# Patient Record
Sex: Male | Born: 1945 | Race: White | Hispanic: No | Marital: Married
Health system: Midwestern US, Community
[De-identification: ages and names within clinical notes are randomized; demographics above are authoritative.]

## PROBLEM LIST (undated history)

## (undated) DIAGNOSIS — E785 Hyperlipidemia, unspecified: Secondary | ICD-10-CM

## (undated) DIAGNOSIS — M778 Other enthesopathies, not elsewhere classified: Secondary | ICD-10-CM

## (undated) DIAGNOSIS — I2510 Atherosclerotic heart disease of native coronary artery without angina pectoris: Secondary | ICD-10-CM

## (undated) DIAGNOSIS — E782 Mixed hyperlipidemia: Secondary | ICD-10-CM

## (undated) DIAGNOSIS — M24131 Other articular cartilage disorders, right wrist: Secondary | ICD-10-CM

## (undated) DIAGNOSIS — R319 Hematuria, unspecified: Secondary | ICD-10-CM

## (undated) DIAGNOSIS — E8881 Metabolic syndrome: Secondary | ICD-10-CM

---

## 2011-02-28 NOTE — Progress Notes (Signed)
HISTORY OF PRESENT ILLNESS  Edward Villarreal is a 65 y.o. male. Referred by Alma Friendly, MD for evaluation of DOE and dizziness.    HPI  Known CAD by Coronary artery calcium scoring (CAC) (score 42 Dec 2009) now with exertional DOE and lightheadeness with aerobic exercise/stairs. He has been active, plays golf regularly. He has noted these sx over the past six months while going to the gym     Cardiac risk factors   HTN no  DM  no  Smoking no  Family hx of CAD yes - father with CABG      Past Medical History   Diagnosis Date   ??? DJD (degenerative joint disease)      L4-L5, tx with PT and nerve blocks   ??? GERD (gastroesophageal reflux disease)    ??? Hyperlipidemia    ??? Sinus bradycardia    ??? Mixed hyperlipidemia    ??? IGT (impaired glucose tolerance)    ??? CAD (coronary artery disease)      CAC 42 Dec 2009     Past Surgical History   Procedure Date   ??? Hx lithotripsy 2007   ??? Biopsy prostate    ??? Hx knee arthroscopy      left     Current Outpatient Prescriptions   Medication Sig Dispense Refill   ??? ascorbic acid (VITAMIN C) 500 mg tablet Take 2,000 mg by mouth two (2) times a day.         ??? ibuprofen (MOTRIN) 400 mg tablet Take 400 mg by mouth two (2) times a day.         ??? aspirin 81 mg tablet Take 162 mg by mouth daily.         ??? omega-3 fatty acids-vitamin e (FISH OIL) 1,000 mg Cap Take 1 Cap by mouth daily.         ??? flaxseed oil 1,000 mg Cap Take 1 Cap by mouth daily.         ??? glucosamine (GLUCOSAMINE RELIEF) 1,000 mg Tab Take 1 Tab by mouth daily.         ??? atorvastatin (LIPITOR) 20 mg tablet Take  by mouth. Alternate 20mg  and 40mg  qod        ??? multivitamin (ONE A DAY) tablet Take 1 Tab by mouth daily.         ??? ranitidine (ZANTAC) 75 mg tablet Take 75 mg by mouth two (2) times daily as needed.         ??? Cholecalciferol, Vitamin D3, (VITAMIN D3) 1,000 unit Cap Take 1 Cap by mouth daily.           No Known Allergies    Married. Retired - Garment/textile technologist. Nonsmoker.     Review of Systems    Constitutional: Negative for fever, chills, malaise/fatigue and diaphoresis.   Respiratory: Negative for cough, hemoptysis, sputum production and wheezing.    Cardiovascular: Negative for chest pain, palpitations, orthopnea, claudication, leg swelling and PND.   Gastrointestinal: Negative for heartburn, nausea, vomiting, blood in stool and melena.   Genitourinary: Negative for dysuria and flank pain.   Musculoskeletal: Positive for back pain. Negative for joint pain.   Skin: Negative for rash.   Neurological: Positive for dizziness. Negative for focal weakness, seizures, loss of consciousness, weakness and headaches.   Endo/Heme/Allergies: Does not bruise/bleed easily.   Psychiatric/Behavioral: Negative for memory loss. The patient does not have insomnia.        BP 122/74  Pulse 60   Ht 5\' 5"  (1.651 m)   Wt 173 lb 12.8 oz (78.835 kg)   BMI 28.92 kg/m2    Physical Exam   Vitals reviewed.  Constitutional: He is oriented to person, place, and time. He appears well-developed.   Neck: Neck supple. No JVD present.   Cardiovascular: Normal rate, regular rhythm and normal heart sounds.  Exam reveals no gallop and no friction rub.    No murmur heard.  Pulses:       Carotid pulses are 2+ on the right side, and 2+ on the left side.       Dorsalis pedis pulses are 2+ on the right side, and 2+ on the left side.   Pulmonary/Chest: Effort normal and breath sounds normal. He has no wheezes. He has no rales.   Abdominal: Soft. Bowel sounds are normal.   Musculoskeletal: He exhibits no edema.   Neurological: He is alert and oriented to person, place, and time.   Skin: Skin is warm and dry.   Psychiatric: He has a normal mood and affect.     EKG 02/14/11 - SB 50, otherwise normal    Lipids March 2012 TC 139 LDL 69 HDL 39 TG 128  FBS 161    ASSESSMENT and PLAN  Encounter Diagnoses   Name Primary?   ??? DOE (dyspnea on exertion) Yes   ??? Dizziness    ??? CAD (coronary artery disease)       Chronic exertional sx - DOE, dizziness but no anginal chest pain, in the setting of documented CAD by CAC. Differential dx from cardiac standpoint includes chronotropic incompetence, myocardial ischemia. No evidence of LV dysfunction or valvular disease by hx, exam and previous echo.   Favor stress cardiolite to evaluate HR, ischemia. Reserve cath for evidence of ischemia.   Long discussion about his lipids/IGT. Reduce consumption of white flour and sugar. May benefit from Advanced lipid testing at some point.     Phone follow up after reviewing tests    Rhona Leavens, MD

## 2011-03-02 NOTE — Telephone Encounter (Signed)
Unable to leave message- phone goes dead after 2 rings.

## 2011-03-05 MED ORDER — NUCLEAR MEDICINE ISOTOPE
Freq: Once | Status: AC
Start: 2011-03-05 — End: 2011-03-05

## 2011-03-05 NOTE — Progress Notes (Signed)
See scanned report. Dr. Kapadia ordered and Dr. Kapadia read study.

## 2011-03-07 NOTE — Telephone Encounter (Signed)
PT would like a call back regarding nuclear results. York Spaniel will only be available today or thursday after 3:00. PT may be reached [phone].      Thanks

## 2011-03-07 NOTE — Telephone Encounter (Signed)
Past Surgical History   Procedure Date   ??? Hx lithotripsy 2007   ??? Biopsy prostate    ??? Hx knee arthroscopy      left   ??? Stress test cardiolite  Normal exercise cardiolite, no inducible ischemia, EF 59% 03/05/11     Spoke with patient regarding normal nuclear stress test results. He still has some questions regarding his dizziness after work out. Please contact patient to discuss results further . Thanks.

## 2011-03-09 NOTE — Telephone Encounter (Signed)
Reviewed stress test findings with pt. Will see him back in a year.

## 2012-03-07 MED ORDER — METFORMIN SR 500 MG 24 HR TABLET
500 mg | ORAL_TABLET | Freq: Every day | ORAL | Status: DC
Start: 2012-03-07 — End: 2012-06-02

## 2012-03-07 NOTE — Patient Instructions (Signed)
I recommend starting metformin extended release preparation, starting at 500 mg daily with supper for a week, then increasing to 1500 mg gradually over time. Increase to 1000 mg for 2 weeks, then 1500 mg daily for 3 weeks.     The best diet for you is a low glycemic index (GI), Mediterranean-style diet.  Glycemic index is a measure of how quickly food turns into sugar in your blood.  You need to eat foods with a LOWER GI.  It is easy to search the Internet for lists of foods and their associated GIs.  Things that are white: sugar, white flour, white pasta, white bread, white rice -- are high GI foods, as are white potatoes.  Avoid these and eat foods with a lower GI.  This will help keep your blood sugar more stable and lower.

## 2012-03-07 NOTE — Progress Notes (Signed)
HISTORY OF PRESENT ILLNESS  Edward Villarreal is a 66 y.o. male. Last seen a year ago    HPI  Feels well. No exertional sx. Exercises regularly. Patient denies any exertional chest pain, dyspnea, palpitations, syncope, orthopnea, edema or paroxysmal nocturnal dyspnea.    Cardiac risk factors   HTN no  DM  no  Smoking no  Family hx of CAD yes - father with CABG    Cardiac testing  Exercise cardiolite 02/2011 - 12 min, normal perfusion, EF 59%      Past Medical History   Diagnosis Date   ??? DJD (degenerative joint disease)      L4-L5, tx with PT and nerve blocks   ??? GERD (gastroesophageal reflux disease)    ??? Hyperlipidemia    ??? Sinus bradycardia    ??? Mixed hyperlipidemia    ??? IGT (impaired glucose tolerance)    ??? CAD (coronary artery disease)      CAC 42 Dec 2009       Current Outpatient Prescriptions   Medication Sig Dispense Refill   ??? propranolol (INDERAL) 80 mg tablet Take 80 mg by mouth as needed.       ??? SILODOSIN (RAPAFLO PO) Take  by mouth every other day.       ??? ibuprofen (MOTRIN) 400 mg tablet Take 400 mg by mouth two (2) times a day.       ??? aspirin 81 mg tablet Take 81 mg by mouth daily.       ??? omega-3 fatty acids-vitamin e (FISH OIL) 1,000 mg Cap Take 1 Cap by mouth daily.         ??? glucosamine (GLUCOSAMINE RELIEF) 1,000 mg Tab Take 1 Tab by mouth daily.         ??? atorvastatin (LIPITOR) 20 mg tablet Take  by mouth. Alternate 20mg  and 40mg  qod        ??? multivitamin (ONE A DAY) tablet Take 1 Tab by mouth daily.         ??? ranitidine (ZANTAC) 75 mg tablet Take 75 mg by mouth two (2) times daily as needed.         ??? Cholecalciferol, Vitamin D3, (VITAMIN D3) 1,000 unit Cap Take 1 Cap by mouth two (2) times a day.         No Known Allergies    Married. Retired - Garment/textile technologist. Nonsmoker.     Review of Systems   Constitutional: Negative for fever, chills, malaise/fatigue and diaphoresis.   Respiratory: Negative for cough, hemoptysis, sputum production and wheezing.    Cardiovascular: Negative for chest pain,  palpitations, orthopnea, claudication, leg swelling and PND.   Gastrointestinal: Negative for heartburn, nausea, vomiting, blood in stool and melena.   Genitourinary: Negative for dysuria and flank pain.   Musculoskeletal: Positive for back pain. Negative for joint pain.   Skin: Negative for rash.   Neurological: Positive for dizziness. Negative for focal weakness, seizures, loss of consciousness, weakness and headaches.   Endo/Heme/Allergies: Does not bruise/bleed easily.   Psychiatric/Behavioral: Negative for memory loss. The patient does not have insomnia.        BP 120/80   Pulse 50   Ht 5\' 5"  (1.651 m)   Wt 172 lb (78.019 kg)   BMI 28.62 kg/m2   SpO2 97%    Physical Exam   Vitals reviewed.  Constitutional: He is oriented to person, place, and time. He appears well-developed.   Neck: Neck supple. No JVD present.  Cardiovascular: Normal rate, regular rhythm and normal heart sounds.  Exam reveals no gallop and no friction rub.    No murmur heard.  Pulses:       Carotid pulses are 2+ on the right side, and 2+ on the left side.       Dorsalis pedis pulses are 2+ on the right side, and 2+ on the left side.   Pulmonary/Chest: Effort normal and breath sounds normal. He has no wheezes. He has no rales.   Abdominal: Soft. Bowel sounds are normal.   Musculoskeletal: He exhibits no edema.   Neurological: He is alert and oriented to person, place, and time.   Skin: Skin is warm and dry.   Psychiatric: He has a normal mood and affect.     EKG today - SB 44 otherwise normal    Lipids March 2012 TC 139 LDL 69 HDL 39 TG 128  FBS 409  NMR lipids 01/28/12 - LDLp 1185, FBS 126    ASSESSMENT and PLAN  Encounter Diagnoses   Name Primary?   ??? CAD (coronary artery disease) Yes   ??? Mixed hyperlipidemia    ??? T2DM (type 2 diabetes mellitus)    ??? Sinus bradycardia      No sx of angina or bradycardia.     Long discussion about his lipids/IGT. Reduce consumption of white flour and sugar but i am not convinced about his motivation to reduce  sugar. I recommend starting metformin extended release preparation, starting at 500 mg daily with supper for a week, then increasing to 1500 mg gradually over time. Increase to 1000 mg for 2 weeks, then 1500 mg daily for 3 weeks.     The patient was encouraged to continue current medications, exercise, lose weight and call with any new complaints or concerns.      Rhona Leavens, MD

## 2012-03-21 NOTE — Telephone Encounter (Signed)
Spoke with patient, id verified. He is experiencing Gi side effects from metformin. We discussed side effects and how to deal with them. For now he is going to reintroduce metformin more slowly and pay more attention to diet, take it with food, etc. He will restart on one metformin 500 at night for 2 weeks then increase to 2 at night for a 2 weeks and update me by phone on his condition. He will call back with any changes in condition, questions, or concerns.

## 2012-03-21 NOTE — Telephone Encounter (Signed)
Pt called stating he is experiencing nausea, diahrea and other symptoms from metFORMIN ER (GLUCOPHAGE XR) 500 mg tablet. Pt can be reached at [phone]. Id verified.

## 2012-05-28 NOTE — Progress Notes (Signed)
HDL lab results from 05/20/12:      Lab test High risk Intermediate risk  Optimal   TC   128   LDL   79   HDL   41   TG   88   Non-HDL   87   Apo-B  62    LDL-p   875   %sdLDL   24   HDL2 8     Lp(a) chol 154     Myeloperoxidase   267   hs-CRP   <0.3   INT-proBNP   62   Aspirin Works   1085   ApoE genotype   3/3   CYP2C19   *1/*1   Insulin   5   Glucose   92   Vitamin D   55   campesterol  4.04 optimal    Campesterol ratio 305 hyper     sitosterol  3.11 optimal    Sitosterol ratio 227 hyper     cholestanol  3.12 optimal    Cholestanol ratio 242 hyper     desmosterol   0.40 hypo   Desmosterol ratio  31    HS Omega 3 index  6.0

## 2012-06-02 MED ORDER — ZOLPIDEM 5 MG TAB
5 mg | ORAL_TABLET | Freq: Every evening | ORAL | Status: DC | PRN
Start: 2012-06-02 — End: 2013-02-05

## 2012-06-02 MED ORDER — METFORMIN SR 500 MG 24 HR TABLET
500 mg | ORAL_TABLET | Freq: Every day | ORAL | Status: DC
Start: 2012-06-02 — End: 2012-09-01

## 2012-06-02 NOTE — Progress Notes (Signed)
HISTORY OF PRESENT ILLNESS  Edward Villarreal is a 66 y.o. male. Last seen 3 months ago    HPI  Appetite reduced but still has sugar craving, albeit less. Tolerating metformin. Feels well. No exertional sx. Plays golf regularly.  Patient denies any exertional chest pain, dyspnea, palpitations, syncope, orthopnea, edema or paroxysmal nocturnal dyspnea.    Takes propanolol prn golf tournaments - tremors    Cardiac risk factors   HTN no  DM  no  Smoking no  Family hx of CAD yes - father with CABG    Cardiac testing  Exercise cardiolite 02/2011 - 12 min, normal perfusion, EF 59%      Past Medical History   Diagnosis Date   ??? DJD (degenerative joint disease)      L4-L5, tx with PT and nerve blocks   ??? GERD (gastroesophageal reflux disease)    ??? Hyperlipidemia    ??? Sinus bradycardia    ??? Mixed hyperlipidemia    ??? IGT (impaired glucose tolerance)    ??? CAD (coronary artery disease)      CAC 42 Dec 2009       Current Outpatient Prescriptions   Medication Sig Dispense Refill   ??? propranolol (INDERAL) 80 mg tablet Take 80 mg by mouth as needed.       ??? metFORMIN ER (GLUCOPHAGE XR) 500 mg tablet Take 3 Tabs by mouth daily (with dinner).  90 Tab  3   ??? ibuprofen (MOTRIN) 400 mg tablet Take 400 mg by mouth two (2) times a day.       ??? aspirin 81 mg tablet Take 81 mg by mouth daily.       ??? omega-3 fatty acids-vitamin e (FISH OIL) 1,000 mg Cap Take 1 Cap by mouth daily.         ??? atorvastatin (LIPITOR) 20 mg tablet Take  by mouth. Alternate 20mg  and 40mg  qod        ??? multivitamin (ONE A DAY) tablet Take 1 Tab by mouth daily.         ??? ranitidine (ZANTAC) 75 mg tablet Take 75 mg by mouth two (2) times daily as needed.         ??? Cholecalciferol, Vitamin D3, (VITAMIN D3) 1,000 unit Cap Take 1 Cap by mouth two (2) times a day.         No Known Allergies    Married. Retired - Garment/textile technologist. Nonsmoker.     Review of Systems   Constitutional: Negative for fever, chills, malaise/fatigue and diaphoresis.   Respiratory: Negative for cough,  hemoptysis, sputum production and wheezing.    Cardiovascular: Negative for chest pain, palpitations, orthopnea, claudication, leg swelling and PND.   Gastrointestinal: Negative for heartburn, nausea, vomiting, blood in stool and melena.   Genitourinary: Negative for dysuria and flank pain.   Musculoskeletal: Positive for back pain. Negative for joint pain.   Skin: Negative for rash.   Neurological: Positive for dizziness. Negative for focal weakness, seizures, loss of consciousness, weakness and headaches.   Endo/Heme/Allergies: Does not bruise/bleed easily.   Psychiatric/Behavioral: Negative for memory loss. The patient does not have insomnia.        BP 130/80   Pulse 52   Ht 5\' 5"  (1.651 m)   Wt 175 lb (79.379 kg)   BMI 29.12 kg/m2   SpO2 98%  Wt Readings from Last 3 Encounters:   06/02/12 175 lb (79.379 kg)   03/07/12 172 lb (78.019 kg)   02/28/11  173 lb 12.8 oz (78.835 kg)     Physical Exam   Vitals reviewed.  Constitutional: He is oriented to person, place, and time. He appears well-developed.   Neck: Neck supple. No JVD present.   Cardiovascular: Normal rate, regular rhythm and normal heart sounds.  Exam reveals no gallop and no friction rub.    No murmur heard.  Pulses:       Carotid pulses are 2+ on the right side, and 2+ on the left side.       Dorsalis pedis pulses are 2+ on the right side, and 2+ on the left side.   Pulmonary/Chest: Effort normal and breath sounds normal. He has no wheezes. He has no rales.   Abdominal: Soft. Bowel sounds are normal.   Musculoskeletal: He exhibits no edema.   Neurological: He is alert and oriented to person, place, and time.   Skin: Skin is warm and dry.   Psychiatric: He has a normal mood and affect.     EKG today - SB 44 otherwise normal    Lipids March 2012 TC 139 LDL 69 HDL 39 TG 128  FBS 161  NMR lipids 01/28/12 - LDLp 1185, FBS 126  HDL lab results from 05/20/12:   Lab test  High risk  Intermediate risk  Optimal    TC    128    LDL    79    HDL    41    TG    88     Non-HDL    87    Apo-B   62     LDL-p    875    %sdLDL    24    HDL2  8      Lp(a) chol  154      Myeloperoxidase    267    hs-CRP    <0.3    INT-proBNP    62    Aspirin Works    1085    ApoE genotype    3/3    CYP2C19    *1/*1    Insulin    5    Glucose    92    Vitamin D    55    campesterol   4.04 optimal     Campesterol ratio  305 hyper      sitosterol   3.11 optimal     Sitosterol ratio  227 hyper      cholestanol   3.12 optimal     Cholestanol ratio  242 hyper      desmosterol    0.40 hypo    Desmosterol ratio   31     HS Omega 3 index   6.0         ASSESSMENT and PLAN  Encounter Diagnoses   Name Primary?   ??? Mixed hyperlipidemia Yes   ??? CAD (coronary artery disease)    ??? T2DM (type 2 diabetes mellitus)    ??? Elevated Lp(a)      No sx of angina or bradycardia.     Great response to metformin - optimized LDL-p, insulin and FBS. Has elevated Lp(a) - discussed this marker at length. Continue current Rx. Long discussion about optimizing his diet    Upcoming travel - pt requests ambien for jet lag    The patient was encouraged to continue current medications, exercise, lose weight and call with any new complaints or concerns.    Follow-up Disposition:  Return in about 6 months (  around 12/03/2012).    HDL labs prior  .  Rhona Leavens, MD

## 2012-09-01 NOTE — Telephone Encounter (Signed)
Verbal Order Jolinda Croak

## 2012-09-02 NOTE — Telephone Encounter (Signed)
Verbal order for refill per Dr. Kapadia

## 2012-09-02 NOTE — Addendum Note (Signed)
Addended by: Lubertha Basque on: 09/02/2012 11:55 AM     Modules accepted: Orders

## 2012-09-02 NOTE — Telephone Encounter (Signed)
What medication does he need a hard copy of??

## 2012-09-02 NOTE — Telephone Encounter (Signed)
Spoke with pharmacist from [facility], Jonny Ruiz. Advised that Metformin was sent in error.

## 2012-09-02 NOTE — Telephone Encounter (Signed)
Pt. Didn't need it called into the pharmacy he needed a hard copy of the script pt. Will be coming to pick it up sometime this afternoon. Thank you

## 2013-01-23 LAB — SMALL DENSE LDL (HDL ONLY)
% sdLDL (calculated): 29 — ABNORMAL HIGH (ref <26)
Fasting, Y/N/Hrs: 12
Small Dense LDL: 23 mg/dL — ABNORMAL HIGH (ref <21)

## 2013-01-23 LAB — LIPID PANEL
Cholesterol, total: 139 mg/dL (ref <200)
Fasting, Y/N/Hrs: 12
HDL Cholesterol: 47 mg/dL (ref >39)
LDL, calculated: 79 mg/dL (ref <100)
Non-HDL Cholesterol: 92 mg/dL (ref <130)
Triglyceride: 154 mg/dL — ABNORMAL HIGH (ref <150)

## 2013-01-23 LAB — CRP, HIGH SENSITIVITY
CRP, High sensitivity: <0.3 mg/L (ref <1.0)
Fasting, Y/N/Hrs: 12

## 2013-01-23 LAB — FIBRINOGEN
Fasting, Y/N/Hrs: 12
Fibrinogen Antigen: 371 mg/dL (ref <391)

## 2013-01-23 LAB — VITAMIN D, 25 HYDROXY
Fasting, Y/N/Hrs: 12
VITAMIN D, 25-HYDROXY: 46 ng/mL (ref 30–100)

## 2013-01-23 LAB — FATTY ACIDS, FREE
FFA free fatty acids: 0.77 mmol/L — ABNORMAL HIGH (ref <0.60)
Fasting, Y/N/Hrs: 12

## 2013-01-23 LAB — HDL 2 SUBCLASS (HDL ONLY)
Fasting, Y/N/Hrs: 12
HDL 2 subclass: 14 mg/dL (ref >11)

## 2013-01-23 LAB — LIPOPROTEIN (A) MASS (HDL ONLY)
Fasting, Y/N/Hrs: 12
Lipoprotein (a): 71 mg/dL — ABNORMAL HIGH (ref <30)

## 2013-01-24 LAB — METABOLIC PANEL, COMPREHENSIVE
ALT (SGPT): 15 U/L (ref <42)
AST (SGOT): 16 U/L (ref <41)
Albumin: 4.4 g/dL (ref 3.5–5.2)
Alk. phosphatase: 78 U/L (ref 40–129)
BUN: 31 mg/dL — ABNORMAL HIGH (ref 6–20)
Bilirubin, total: 0.7 mg/dL (ref <1.3)
CO2: 25 mmol/L (ref 22–29)
Calcium: 9.1 mg/dL (ref 8.6–10.2)
Chloride: 107 mmol/L (ref 98–107)
Creatinine: 0.9 mg/dL (ref 0.7–1.2)
Fasting, Y/N/Hrs: 12
Glucose: 96 mg/dL (ref 70–99)
Potassium: 4.5 mmol/L (ref 3.5–5.1)
Protein, total: 7.2 g/dL (ref 6.1–8.0)
Sodium: 144 mmol/L (ref 133–145)

## 2013-01-24 LAB — URINE CREATININE (HDL ONLY)
Creatinine w AspirinWorks: 138.2 mg/dL (ref 39.0–259.0)
Creatinine w AspirinWorks: 138.2 mg/dL (ref 39.0–259.0)
Fasting, Y/N/Hrs: 12
Fasting, Y/N/Hrs: 12

## 2013-01-24 LAB — APOLIPOPROTEIN B
Apolipoprotein B: 70 mg/dL — ABNORMAL HIGH (ref <60)
Fasting, Y/N/Hrs: 12

## 2013-01-24 LAB — LP(A)-P (HDL ONLY)
Fasting, Y/N/Hrs: 12
Fasting, Y/N/Hrs: 12
Lp(a)-P: 191 nmol/L — ABNORMAL HIGH (ref <75)
Lp(a)-P: 191 nmol/L — ABNORMAL HIGH (ref <75)

## 2013-01-24 LAB — LDL-P HDL (HDL ONLY)
Fasting, Y/N/Hrs: 12
Fasting, Y/N/Hrs: 12
HDL HDL-P: 37.1 umol/L — ABNORMAL LOW (ref >38.0)
HDL HDL-P: 37.1 umol/L — ABNORMAL LOW (ref >38.0)
HDL LDL-P: 899 nmol/L (ref <1020)
HDL LDL-P: 899 nmol/L (ref <1020)
HDL SLDL-P: 210 nmol/L
HDL SLDL-P: 210 nmol/L

## 2013-01-24 LAB — INSULIN
Fasting, Y/N/Hrs: 12
Insulin: 14 uU/mL — ABNORMAL HIGH (ref 3–9)

## 2013-01-24 LAB — MYELOPEROXIDASE, AB
Fasting, Y/N/Hrs: 12
Myeloperoxidase: 217 pmol/L (ref <400)

## 2013-01-24 LAB — LIPOPROTEIN ASSOC PHOSPHOLIPID
Fasting, Y/N/Hrs: 12
Lp-PLA2: 127 ng/mL (ref <200)

## 2013-01-24 LAB — APOLIPOPROTEIN A-1
Apo B: Apo A1 Ratio: 0.53 — ABNORMAL LOW (ref 0.61–0.80)
Apolipoprotein A-1: 133 mg/dL (ref >131)
Fasting, Y/N/Hrs: 12

## 2013-01-24 LAB — NT-PRO BNP
Fasting, Y/N/Hrs: 12
Pro BNP,NT: 47 pg/mL (ref <125)

## 2013-01-24 LAB — ASPIRINWORKS(11-DEHYDRO-THROMBOXANE B2)(HDL ONLY)
11-Dehydro-Thromboxane B2: 500 pg/mL (ref <1501)
Fasting, Y/N/Hrs: 12

## 2013-01-25 LAB — STEROLS (HDL ONLY)
Campesterol: 5.22 ug/mL — ABNORMAL HIGH (ref 2.11–4.43)
Cholestanol: 3.15 ug/mL (ref 2.02–3.47)
Desmosterol: 0.57 ug/mL (ref 0.50–1.27)
Fasting, Y/N/Hrs: 12
Sitosterol: 3.62 ug/mL — ABNORMAL HIGH (ref 1.43–3.17)

## 2013-01-25 LAB — BLOOD FATTY ACIDS PROFILE (HDL ONLY)
Alpha linoleic acid n3: 0.115 % (ref 0.100–0.400)
Cis-mono-unsaturated fatty acid total: 16.7 (ref 11.5–20.5)
Docosapentaenoic acid n3: 2.251 % (ref 0.600–4.100)
Docosapentaenoic acid n6: 0.795 % (ref 0.100–1.300)
Fasting, Y/N/Hrs: 12
Omega-3 Fatty Acid total: 7.3 (ref 0.1–14.1)
Omega-3 index: 4.9 (ref 0.1–10.4)
Omega-6 Fatty Acid total: 32.2 (ref 28.6–44.5)
Saturated fatty acid total: 42.8 — ABNORMAL HIGH (ref 36.6–42.0)
Trans fatty acid total: 1.1 (ref 0.1–1.8)
Translinoleic acid: 0.181 % (ref 0.100–0.500)
Transoleic acid: 0.673 % (ref 0.100–1.300)

## 2013-01-27 NOTE — Progress Notes (Signed)
Quick Note:    Will review at visit  ______

## 2013-01-28 NOTE — Progress Notes (Signed)
HDL results from 01/23/13        Lab test High risk Intermediate risk  Optimal   TC   139   LDL   79   HDL   47   TG  086    Non-HDL   92   Apo-B  70    LDL-p   899   %sdLDL  29    HDL2   14   Lp(a) chol      Myeloperoxidase   217   hs-CRP   <0.3   INT-proBNP   47   Aspirin Works   500   ApoE genotype      CYP2C19      Insulin 14     Glucose   96   Vitamin D   46   campesterol 5.22 hyper     Campesterol ratio      sitosterol 3.62 hyper     Sitosterol ratio      cholestanol   3.15   Cholestanol ratio      desmosterol   0.57   Desmosterol ratio      HS Omega 3 index  4.9

## 2013-02-05 NOTE — Progress Notes (Signed)
HISTORY OF PRESENT ILLNESS  Edward Villarreal is a 67 y.o. male. Last seen 8 months ago    Problem List Never Reviewed        ICD-9-CM Class Noted    T2DM (type 2 diabetes mellitus) 250.00  03/07/2012        Sinus bradycardia 427.89  03/07/2012        Mixed hyperlipidemia 272.2  02/28/2011        IGT (impaired glucose tolerance) 790.22  02/28/2011        CAD (coronary artery disease) 414.00  02/28/2011            Cardiac testing  Exercise cardiolite 02/2011 - 12 min, normal perfusion, EF 59%      HPI  Feels good. No interval issues.  Tolerating metformin. Spent the winter in [place] - ate lots of fruit. Consumes gluten free pasta. Does not do regular exercise.. No exertional sx. Plays golf regularly.  Patient denies any exertional chest pain, dyspnea, palpitations, syncope, orthopnea, edema or paroxysmal nocturnal dyspnea.    Taking atorva 40 alternating with 20    Takes propanolol prn golf tournaments - tremors    Cardiac risk factors   HTN no  DM  no  Smoking no  Family hx of CAD yes - father with CABG      Past Medical History   Diagnosis Date   ??? DJD (degenerative joint disease)      L4-L5, tx with PT and nerve blocks   ??? GERD (gastroesophageal reflux disease)    ??? Hyperlipidemia    ??? Sinus bradycardia    ??? Mixed hyperlipidemia    ??? IGT (impaired glucose tolerance)    ??? CAD (coronary artery disease)      CAC 42 Dec 2009       Current Outpatient Prescriptions   Medication Sig Dispense Refill   ??? atorvastatin (LIPITOR) 40 mg tablet Take 40 mg by mouth daily.       ??? metFORMIN ER (GLUCOPHAGE XR) 500 mg tablet Take 3 Tabs by mouth daily (with dinner).  90 Tab  7   ??? propranolol (INDERAL) 80 mg tablet Take 80 mg by mouth as needed.       ??? ibuprofen (MOTRIN) 400 mg tablet Take 400 mg by mouth two (2) times a day.       ??? aspirin 81 mg tablet Take 81 mg by mouth daily.       ??? multivitamin (ONE A DAY) tablet Take 1 Tab by mouth daily.         ??? ranitidine (ZANTAC) 75 mg tablet Take 75 mg by mouth two (2) times daily as needed.          ??? Cholecalciferol, Vitamin D3, (VITAMIN D3) 1,000 unit Cap Take 1 Cap by mouth two (2) times a day.         No Known Allergies    Married. Retired - Garment/textile technologist. Nonsmoker.     Review of Systems   Constitutional: Negative for fever, chills, malaise/fatigue and diaphoresis.   Respiratory: Negative for cough, hemoptysis, sputum production and wheezing.    Cardiovascular: Negative for chest pain, palpitations, orthopnea, claudication, leg swelling and PND.   Gastrointestinal: Negative for heartburn, nausea, vomiting, blood in stool and melena.   Genitourinary: Negative for dysuria and flank pain.   Musculoskeletal: Positive for back pain. Negative for joint pain.   Skin: Negative for rash.   Neurological: Positive for dizziness. Negative for focal weakness, seizures, loss  of consciousness, weakness and headaches.   Endo/Heme/Allergies: Does not bruise/bleed easily.   Psychiatric/Behavioral: Negative for memory loss. The patient does not have insomnia.        BP 128/72   Pulse 48   Resp 16   Ht 5\' 5"  (1.651 m)   Wt 171 lb (77.565 kg)   BMI 28.46 kg/m2   SpO2 98%  Wt Readings from Last 3 Encounters:   02/05/13 171 lb (77.565 kg)   06/02/12 175 lb (79.379 kg)   03/07/12 172 lb (78.019 kg)     Physical Exam   Vitals reviewed.  Constitutional: He is oriented to person, place, and time. He appears well-developed.   Neck: Neck supple. No JVD present.   Cardiovascular: Normal rate, regular rhythm and normal heart sounds.  Exam reveals no gallop and no friction rub.    No murmur heard.  Pulses:       Carotid pulses are 2+ on the right side, and 2+ on the left side.       Dorsalis pedis pulses are 2+ on the right side, and 2+ on the left side.   Pulmonary/Chest: Effort normal and breath sounds normal. He has no wheezes. He has no rales.   Abdominal: Soft. Bowel sounds are normal.   Musculoskeletal: He exhibits no edema.   Neurological: He is alert and oriented to person, place, and time.   Skin: Skin is warm and dry.    Psychiatric: He has a normal mood and affect.     EKG today - SB 44 otherwise normal    Lipids March 2012 TC 139 LDL 69 HDL 39 TG 128  FBS 045  NMR lipids 01/28/12 - LDLp 1185, FBS 126  HDL lab results from 05/20/12:   Lab test  High risk  Intermediate risk  Optimal    TC    128    LDL    79    HDL    41    TG    88    Non-HDL    87    Apo-B   62     LDL-p    875    %sdLDL    24    HDL2  8      Lp(a) chol  154      Myeloperoxidase    267    hs-CRP    <0.3    INT-proBNP    62    Aspirin Works    1085    ApoE genotype    3/3    CYP2C19    *1/*1    Insulin    5    Glucose    92    Vitamin D    55    campesterol   4.04 optimal     Campesterol ratio  305 hyper      sitosterol   3.11 optimal     Sitosterol ratio  227 hyper      cholestanol   3.12 optimal     Cholestanol ratio  242 hyper      desmosterol    0.40 hypo    Desmosterol ratio   31     HS Omega 3 index   6.0       HDL results from 01/23/13   Lab test  High risk  Intermediate risk  Optimal    TC    139    LDL    79    HDL    47  TG   154     Non-HDL    92    Apo-B   70     LDL-p    899    %sdLDL   29     HDL2    14    Lp(a) chol       Myeloperoxidase    217    hs-CRP    <0.3    INT-proBNP    47    Aspirin Works    500    ApoE genotype       CYP2C19       Insulin  14      Glucose    96    Vitamin D    46    campesterol  5.22 hyper      Campesterol ratio       sitosterol  3.62 hyper      Sitosterol ratio       cholestanol    3.15    Cholestanol ratio       desmosterol    0.57    Desmosterol ratio       HS Omega 3 index   4.9     EKG today - sinus 48, normal    ASSESSMENT and PLAN  Encounter Diagnoses   Name Primary?   ??? CAD (coronary artery disease) Yes   ??? Mixed hyperlipidemia    ??? T2DM (type 2 diabetes mellitus)    ??? Agatston CAC score, <100    ??? Sinus bradycardia      No sx of angina or bradycardia.     Advanced lipid testing reviewed  - stable lipoproteins on Rx- reduce atorva to 20 mg/d  - interval increase in insulin/FFA likely related to increased  carbs/fruit. He knows what to do. Advised to exercise regularly including resistance training    The patient was encouraged to continue current medications, exercise, lose weight and call with any new complaints or concerns.    Follow-up Disposition:  Return in about 4 months (around 06/07/2013).    HDL labs prior  .  Rhona Leavens, MD

## 2013-02-05 NOTE — Patient Instructions (Signed)
Insulin worse    Call HDL coaches

## 2013-04-19 NOTE — ED Notes (Signed)
Pt has received verbal and written discharge instructions per Dr. Mason at this time. Pt has no further questions at time of discharge

## 2013-04-19 NOTE — ED Notes (Signed)
67 y.o.  Pt states onset of symptoms Friday pt states that he got stung on left hand by a yellow jack pt states that pain level at this time is 0/10 but itching10/10 pt denies any relief from OTC benadryl

## 2013-04-19 NOTE — ED Provider Notes (Signed)
HPI Comments: Bee sting to L hand 2 days ago.  + swelling, itching, and warmth.  No systemic symptoms.  Pt now c/o itching to L hand.  Taking benadryl and zantac without relief.  No pain.  Pt denies sob, wheezing, chest pain.  No fevers.    Patient is a 67 y.o. male presenting with bee sting. The history is provided by the patient.   Bee sting   Pertinent negatives include no chest pain, no visual disturbance, no abdominal pain, no headaches and no cough.        Past Medical History   Diagnosis Date   ??? DJD (degenerative joint disease)      L4-L5, tx with PT and nerve blocks   ??? GERD (gastroesophageal reflux disease)    ??? Hyperlipidemia    ??? Sinus bradycardia    ??? Mixed hyperlipidemia    ??? IGT (impaired glucose tolerance)    ??? CAD (coronary artery disease)      CAC 42 Dec 2009        Past Surgical History   Procedure Laterality Date   ??? Biopsy prostate     ??? Stress test cardiolite  03/05/11   ??? Hx lithotripsy  2007   ??? Hx knee arthroscopy       left         Family History   Problem Relation Age of Onset   ??? Dementia Mother    ??? Diabetes Mother    ??? Heart Attack Mother    ??? Arthritis-osteo Father    ??? Dementia Father    ??? Other Father      gallbladder disease        History     Social History   ??? Marital Status: MARRIED     Spouse Name: N/A     Number of Children: N/A   ??? Years of Education: N/A     Occupational History   ??? Not on file.     Social History Main Topics   ??? Smoking status: Never Smoker    ??? Smokeless tobacco: Never Used   ??? Alcohol Use: 0.5 oz/week     1 Cans of beer per week      Comment: rarely   ??? Drug Use: No   ??? Sexually Active: Not on file     Other Topics Concern   ??? Not on file     Social History Narrative   ??? No narrative on file                  ALLERGIES: Review of patient's allergies indicates no known allergies.      Review of Systems   Constitutional: Negative for fever and diaphoresis.   HENT: Negative for facial swelling.    Eyes: Negative for visual disturbance.   Respiratory: Negative  for cough.    Cardiovascular: Negative for chest pain.   Gastrointestinal: Negative for abdominal pain.   Genitourinary: Negative for dysuria.   Musculoskeletal: Negative for joint swelling.   Skin: Negative for rash.   Neurological: Negative for headaches.   Hematological: Negative for adenopathy.   Psychiatric/Behavioral: Negative for suicidal ideas.       Filed Vitals:    04/19/13 0832   BP: 163/76   Pulse: 46   Temp: 98.1 ??F (36.7 ??C)   Resp: 18   Height: 5' 5.2" (1.656 m)   Weight: 77.021 kg (169 lb 12.8 oz)   SpO2: 98%  Physical Exam   Nursing note and vitals reviewed.  Constitutional: He is oriented to person, place, and time. He appears well-developed and well-nourished. No distress.   HENT:   Head: Normocephalic and atraumatic.   Mouth/Throat: Oropharynx is clear and moist.   Eyes: Pupils are equal, round, and reactive to light.   Neck: Normal range of motion. Neck supple.   Cardiovascular: Normal rate, regular rhythm, normal heart sounds and intact distal pulses.    Pulmonary/Chest: Effort normal and breath sounds normal. No respiratory distress.   Abdominal: Soft. Bowel sounds are normal. He exhibits no distension. There is no tenderness.   Musculoskeletal: Normal range of motion. He exhibits edema.   Edema to L hand and L wrist.  No evidence of remaining foreign body.  No lymphadenopathy.  NV intact.   Neurological: He is alert and oriented to person, place, and time.   Skin: Skin is warm and dry.        MDM     Differential Diagnosis; Clinical Impression; Plan:     A:  67yo M with localized reaction to bee sting.  VS stable    P:  Hydroxyzine  Ibuprofen  Zantac  Ice/elevation          Procedures

## 2013-06-05 LAB — SMALL DENSE LDL (HDL ONLY)
% sdLDL (calculated): 25 (ref <26)
Fasting, Y/N/Hrs: 14
Small Dense LDL: 25 mg/dL — ABNORMAL HIGH (ref <21)

## 2013-06-05 LAB — FATTY ACIDS, FREE (HDL ONLY)
FFA free fatty acids: 0.61 mmol/L — ABNORMAL HIGH (ref <0.60)
Fasting, Y/N/Hrs: 14

## 2013-06-05 LAB — MYELOPEROXIDASE, AB (HDL ONLY)
Fasting, Y/N/Hrs: 14
MPOITA: 298 pmol/L (ref <321)

## 2013-06-05 LAB — CRP, HIGH SENSITIVITY
CRP, High sensitivity: 0.4 mg/L (ref <1.0)
Fasting, Y/N/Hrs: 14

## 2013-06-05 LAB — FIBRINOGEN (HDL ONLY)
Fasting, Y/N/Hrs: 14
Fibrinogen Antigen: 425 mg/dL (ref 126–437)

## 2013-06-05 LAB — LIPID PANEL (HDL ONLY)
Cholesterol, total: 161 mg/dL (ref <200)
Fasting, Y/N/Hrs: 14
HDL Cholesterol: 46 mg/dL (ref >39)
LDL, calculated: 101 mg/dL — ABNORMAL HIGH (ref <100)
Non-HDL Cholesterol: 114 mg/dL (ref <130)
Triglyceride: 117 mg/dL (ref <150)

## 2013-06-05 LAB — APOLIPOPROTEIN A-1 (HDL ONLY)
Apo B: Apo A1 Ratio: 0.65 (ref 0.61–0.80)
Apolipoprotein A-1: 125 mg/dL — ABNORMAL LOW (ref >131)
Fasting, Y/N/Hrs: 14

## 2013-06-05 LAB — LIPOPROTEIN (A) MASS (HDL ONLY)
Fasting, Y/N/Hrs: 14
Lipoprotein (a): 81 mg/dL — ABNORMAL HIGH (ref <30)

## 2013-06-05 LAB — URINE CREATININE (HDL ONLY)
Creatinine w AspirinWorks: 279.1 mg/dL — ABNORMAL HIGH (ref 39.0–259.0)
Fasting, Y/N/Hrs: 14

## 2013-06-05 LAB — HDL 2 SUBCLASS (HDL ONLY)
Fasting, Y/N/Hrs: 14
HDL 2 subclass: 13 mg/dL (ref >11)

## 2013-06-05 LAB — APOLIPOPROTEIN B (HDL ONLY)
Apolipoprotein B: 81 mg/dL — ABNORMAL HIGH (ref <60)
Fasting, Y/N/Hrs: 14

## 2013-06-06 LAB — LP(A)-P (HDL ONLY)
Fasting, Y/N/Hrs: 14
Lp(a)-P: 223 nmol/L — ABNORMAL HIGH (ref <75)

## 2013-06-06 LAB — METABOLIC PANEL, COMPREHENSIVE (HDL ONLY)
ALT (SGPT): 15 U/L (ref <42)
AST (SGOT): 16 U/L (ref <41)
Albumin: 4.3 g/dL (ref 3.7–5.1)
Alk. phosphatase: 80 U/L (ref 40–129)
BUN: 25 mg/dL — ABNORMAL HIGH (ref 6–20)
Bilirubin, total: 0.8 mg/dL (ref <1.3)
CO2: 25 mmol/L (ref 22–29)
Calcium: 9.5 mg/dL (ref 8.6–10.2)
Chloride: 107 mmol/L (ref 98–107)
Creatinine: 1 mg/dL (ref 0.7–1.2)
Fasting, Y/N/Hrs: 14
Glucose: 92 mg/dL (ref 70–99)
Potassium: 4.6 mmol/L (ref 3.5–5.1)
Protein, total: 6.9 g/dL (ref 6.1–8.0)
Sodium: 141 mmol/L (ref 133–145)

## 2013-06-06 LAB — STEROLS (HDL ONLY)
AA: 1
Campesterol: 4.66 ug/mL — ABNORMAL HIGH (ref 2.11–4.43)
Cholestanol: 3.53 ug/mL — ABNORMAL HIGH (ref 2.02–3.47)
Desmosterol: 0.61 ug/mL (ref <1.28)
Fasting, Y/N/Hrs: 14
Sitosterol: 3.6 ug/mL — ABNORMAL HIGH (ref 1.43–3.17)

## 2013-06-06 LAB — LIPOPROTEIN ASSOC PHOSPHOLIPID (HDL ONLY)
Fasting, Y/N/Hrs: 14
Lp-PLA2: 143 ng/mL (ref <200)

## 2013-06-06 LAB — INSULIN (HDL ONLY)
Fasting, Y/N/Hrs: 14
Insulin: 9 uU/mL (ref 3–9)

## 2013-06-06 LAB — VITAMIN D, 25 HYDROXY (HDL ONLY)
Fasting, Y/N/Hrs: 14
VITAMIN D, 25-HYDROXY: 34 ng/mL (ref 30–100)

## 2013-06-06 LAB — LDL-P HDL (HDL ONLY)
Fasting, Y/N/Hrs: 14
HDL HDL-P: 37 umol/L — ABNORMAL LOW (ref >38.0)
HDL LDL-P: 1518 nmol/L — ABNORMAL HIGH (ref <1020)
HDL SLDL-P: 629 nmol/L

## 2013-06-06 LAB — NT-PROBNP (HDL ONLY)
Fasting, Y/N/Hrs: 14
Pro BNP,NT: 73 pg/mL (ref <125)

## 2013-06-07 LAB — BLOOD FATTY ACIDS PROFILE (HDL ONLY)
Alpha linoleic acid n3: 0.153 % (ref 0.100–0.400)
Cis-mono-unsaturated fatty acid total: 15.8 (ref 11.5–20.5)
Docosapentaenoic acid n3: 2.778 % (ref 0.600–4.100)
Docosapentaenoic acid n6: 0.81 % (ref 0.100–1.300)
Fasting, Y/N/Hrs: 14
Omega-3 Fatty Acid total: 8.3 (ref 0.1–14.1)
Omega-3 index: 5.4 (ref 0.1–10.4)
Omega-6 Fatty Acid total: 34.6 (ref 28.6–44.5)
Saturated fatty acid total: 40.3 (ref 36.6–42.0)
Trans fatty acid total: 1 (ref 0.1–1.8)
Translinoleic acid: 0.142 % (ref 0.100–0.500)
Transoleic acid: 0.624 % (ref 0.100–1.300)

## 2013-06-09 LAB — ASPIRINWORKS(11-DEHYDRO-THROMBOXANE B2)(HDL ONLY)
11-Dehydro-Thromboxane B2: 2041 pg/mL — ABNORMAL HIGH (ref <1501)
Fasting, Y/N/Hrs: 14

## 2013-06-10 NOTE — Progress Notes (Signed)
Quick Note:    Will review at visit    ______

## 2013-06-17 NOTE — Progress Notes (Signed)
HDL results from 06/05/13        Lab test High risk Intermediate risk  Optimal   TC   161   LDL  101    HDL   46   TG   117   Non-HDL   114   Apo-B 81     LDL-p 1518     %sdLDL   25   HDL2   12   Lp(a) chol      Myeloperoxidase   298   hs-CRP   0.4   INT-proBNP   73   Aspirin Works 2041     ApoE genotype      CYP2C19      Insulin   9   Hgb A1c      Est avg glucose      Glucose   92   Vitamin D   34   campesterol 4.66 hyper     campesterol ratio      sitosterol 3.60 hyper     Sitosterol ratio      Cholestanol  3.53 hyper     Cholestanol ratio      desmosterol   0.61   Desmosterol ratio      HS Omega 3 index  5.4

## 2013-06-26 NOTE — Progress Notes (Signed)
HISTORY OF PRESENT ILLNESS  Edward Villarreal is a 67 y.o. male. Last seen 5 months ago    Problem List Never Reviewed        ICD-9-CM Class Noted    Agatston CAC score, <100 793.2  02/05/2013        T2DM (type 2 diabetes mellitus) 250.00  03/07/2012        Sinus bradycardia 427.89  03/07/2012        Mixed hyperlipidemia 272.2  02/28/2011        CAD (coronary artery disease) 414.00  02/28/2011            Cardiac testing    CAC 09/27/2008  - 42.7  Exercise cardiolite 02/2011 - 12 min, normal perfusion, EF 59%    HPI  Feels good. No interval issues.  Tolerating metformin. Eating better: not craving sugar.  No exertional sx. Plays golf regularly.  Patient denies any exertional chest pain, dyspnea, palpitations, syncope, orthopnea, edema or paroxysmal nocturnal dyspnea.    Takes ranitidine as needed. Takes propanolol prn golf tournaments for tremors.    Cardiac risk factors   HTN no  DM  no  Smoking no  Family hx of CAD yes - father with CABG      Past Medical History   Diagnosis Date   ??? DJD (degenerative joint disease)      L4-L5, tx with PT and nerve blocks   ??? GERD (gastroesophageal reflux disease)    ??? Hyperlipidemia    ??? Sinus bradycardia    ??? Mixed hyperlipidemia    ??? IGT (impaired glucose tolerance)    ??? CAD (coronary artery disease)      CAC 42 Dec 2009       Current Outpatient Prescriptions   Medication Sig Dispense Refill   ??? atorvastatin (LIPITOR) 20 mg tablet Take  by mouth daily.       ??? metFORMIN ER (GLUCOPHAGE XR) 500 mg tablet Take 3 Tabs by mouth daily (with dinner).  270 Tab  3   ??? propranolol (INDERAL) 80 mg tablet Take 40 mg by mouth as needed.       ??? ranitidine (ZANTAC) 75 mg tablet Take 150 mg by mouth as needed.         No Known Allergies    Married. Retired - Garment/textile technologist. Nonsmoker.     Review of Systems   Constitutional: Negative for fever, chills, malaise/fatigue and diaphoresis.   Respiratory: Negative for cough, hemoptysis, sputum production and wheezing.    Cardiovascular: Negative for chest  pain, palpitations, orthopnea, claudication, leg swelling and PND.   Gastrointestinal: Negative for heartburn, nausea, vomiting, blood in stool and melena.   Genitourinary: Negative for dysuria and flank pain.   Musculoskeletal: Positive for back pain. Negative for joint pain.   Skin: Negative for rash.   Neurological: Positive for dizziness. Negative for focal weakness, seizures, loss of consciousness, weakness and headaches.   Endo/Heme/Allergies: Does not bruise/bleed easily.   Psychiatric/Behavioral: Negative for memory loss. The patient does not have insomnia.        BP 120/80   Pulse 60   Resp 16   Ht 5' 5.2" (1.656 m)   Wt 165 lb (74.844 kg)   BMI 27.29 kg/m2   SpO2 98%  Wt Readings from Last 3 Encounters:   06/26/13 165 lb (74.844 kg)   04/19/13 169 lb 12.8 oz (77.021 kg)   02/05/13 171 lb (77.565 kg)     Physical Exam  Vitals reviewed.  Constitutional: He is oriented to person, place, and time. He appears well-developed.   Neck: Neck supple. No JVD present.   Cardiovascular: Normal rate, regular rhythm and normal heart sounds.  Exam reveals no gallop and no friction rub.    No murmur heard.  Pulses:       Carotid pulses are 2+ on the right side, and 2+ on the left side.       Dorsalis pedis pulses are 2+ on the right side, and 2+ on the left side.   Pulmonary/Chest: Effort normal and breath sounds normal. He has no wheezes. He has no rales.   Abdominal: Soft. Bowel sounds are normal.   Musculoskeletal: He exhibits no edema.   Neurological: He is alert and oriented to person, place, and time.   Skin: Skin is warm and dry.   Psychiatric: He has a normal mood and affect.   HDL results from 01/23/13   Lab test  High risk  Intermediate risk  Optimal    TC    139    LDL    79    HDL    47    TG   154     Non-HDL    92    Apo-B   70     LDL-p    899    %sdLDL   29     HDL2    14    Lp(a) chol       Myeloperoxidase    217    hs-CRP    <0.3    INT-proBNP    47    Aspirin Works    500    ApoE genotype       CYP2C19        Insulin  14      Glucose    96    Vitamin D    46    campesterol  5.22 hyper      Campesterol ratio       sitosterol  3.62 hyper      Sitosterol ratio       cholestanol    3.15    Cholestanol ratio       desmosterol    0.57    Desmosterol ratio       HS Omega 3 index   4.9     HDL results from 06/05/13   Lab test  High risk  Intermediate risk  Optimal    TC    161    LDL   101     HDL    46    TG    117    Non-HDL    114    Apo-B  81      LDL-p  1518      %sdLDL    25    HDL2    12    Lp(a) chol       Myeloperoxidase    298    hs-CRP    0.4    INT-proBNP    73    Aspirin Works  2041      ApoE genotype       CYP2C19       Insulin    9    Hgb A1c       Est avg glucose       Glucose    92    Vitamin D    34    campesterol  4.66 hyper  campesterol ratio       sitosterol  3.60 hyper      Sitosterol ratio       Cholestanol  3.53 hyper      Cholestanol ratio       desmosterol    0.61    Desmosterol ratio       HS Omega 3 index   5.4       EKG 01/2013- sinus 48, normal    ASSESSMENT and PLAN  Encounter Diagnoses   Name Primary?   ??? CAD (coronary artery disease) Yes   ??? Agatston CAC score, <100    ??? T2DM (type 2 diabetes mellitus)    ??? Sinus bradycardia      No sx of angina or bradycardia.     Advanced lipid testing reviewed  - interval increase in LDL-p coupled with sterol hyperabsorption  - interval normalization of IR with dietary changes  - increase aspirin works reflects discontinuation of aspirin    Suggest:  Zetia 10 mg daily  Resume aspirin 81 mg daily    The patient was encouraged to continue current medications, exercise, lose weight and call with any new complaints or concerns.    Follow-up Disposition:  Return in about 2 months (around 08/26/2013). See Heriberto Antigua  HDL labs prior    Written by Demetrius Revel, scribe, as dictated by Dr. Doreene Adas.  Rhona Leavens, MD

## 2013-06-26 NOTE — Patient Instructions (Signed)
Start Zetia 10 mg daily.    Resume chewable or uncoated baby aspirin 81 mg daily.

## 2013-06-26 NOTE — Telephone Encounter (Signed)
Please call patient at [phone] is there anything else he can take cost of Zetia if $200 out of his pocket.  Thanks Clydie Braun

## 2013-06-29 NOTE — Telephone Encounter (Signed)
Spoke with patient, ID verified.  Patient prescribed Zetia and insurance will not cover.  Advised to have his pharmacy to send our office a prio autho form.  Set aside 4 boxes of samples.  Provided fax number to patient.  Patient verbalized understanding.

## 2013-06-29 NOTE — Telephone Encounter (Signed)
Returned call, no answer, left message to call office in the morning.

## 2013-06-29 NOTE — Telephone Encounter (Signed)
Pt. Returning your call. You can reach him @ [phone] Thanks, Annabelle Harman

## 2013-06-29 NOTE — Telephone Encounter (Signed)
Pt. Called with medication questions. You can reach him @ [phone] Thanks, Annabelle Harman

## 2013-06-29 NOTE — Telephone Encounter (Signed)
Pt called regarding a medication he was recently prescribed.  Please give him a call back at [phone]

## 2013-06-30 NOTE — Telephone Encounter (Signed)
Spoke with patient, ID verified.  Patient's insurance does not need a prio-auth of Zetia.  Patient will continue taking until next visit with Margee Krebs.  Zetia is to costly.  Advised to keep in contact with our office for samples.

## 2013-07-16 NOTE — Telephone Encounter (Signed)
Pt called requesting samples for Zetia.  Please give him a call back at [phone]

## 2013-07-16 NOTE — Telephone Encounter (Signed)
Left Message on identifiable machine that  we are out of Zetia to call back the beginning of the week and check again

## 2013-07-21 NOTE — Telephone Encounter (Signed)
Please call pt. Regarding zetia samples. His number is [phone] Thanks, Annabelle Harman

## 2013-07-21 NOTE — Telephone Encounter (Signed)
Verbal order to refill medication per Dr. Milbert Coulter   Zetia 10 mg 1 tablet  daily

## 2013-08-04 NOTE — Telephone Encounter (Signed)
Patient notified that samples have been set aside, per Dr. Tammy Sours verbal order refill   Zetia 10 mg QD

## 2013-08-04 NOTE — Telephone Encounter (Signed)
Patient wants to know if we have any samples of ZETIA 10 mg that he can pick up [phone].  Thanks Clydie Braun

## 2013-08-07 LAB — SMALL DENSE LDL (HDL ONLY)
% sdLDL (calculated): 23 (ref <26)
Fasting, Y/N/Hrs: 13
Small Dense LDL: 18 mg/dL (ref <21)

## 2013-08-07 LAB — FATTY ACIDS, FREE (HDL ONLY)
FFA free fatty acids: 0.38 mmol/L (ref <0.60)
Fasting, Y/N/Hrs: 13

## 2013-08-07 LAB — CRP, HIGH SENSITIVITY
CRP, High sensitivity: 0.3 mg/L (ref <1.0)
Fasting, Y/N/Hrs: 13

## 2013-08-07 LAB — LIPID PANEL (HDL ONLY)
Cholesterol, total: 122 mg/dL (ref <200)
Fasting, Y/N/Hrs: 13
HDL Cholesterol: 44 mg/dL (ref >39)
LDL, calculated: 79 mg/dL (ref <100)
Non-HDL Cholesterol: 79 mg/dL (ref <130)
Triglyceride: 90 mg/dL (ref <150)

## 2013-08-07 LAB — APOLIPOPROTEIN A-1 (HDL ONLY)
Apo B: Apo A1 Ratio: 0.48 — ABNORMAL LOW (ref 0.61–0.80)
Apolipoprotein A-1: 119 mg/dL — ABNORMAL LOW (ref >131)
Fasting, Y/N/Hrs: 13

## 2013-08-07 LAB — HOMOCYSTEINE (HDL ONLY)
Fasting, Y/N/Hrs: 13
Homocysteine, plasma: 8 umol/L (ref <11)

## 2013-08-07 LAB — HDL 2 SUBCLASS (HDL ONLY)
Fasting, Y/N/Hrs: 13
HDL 2 subclass: 12 mg/dL (ref >11)

## 2013-08-08 LAB — METABOLIC PANEL, COMPREHENSIVE (HDL ONLY)
% ALBUMIN: 63 % (ref 54–71)
ALB/GLOBRATIO: 1.71 (ref 1.15–2.50)
ALT (SGPT): 14 U/L (ref <42)
AST (SGOT): 14 U/L (ref <41)
Albumin: 4.3 g/dL (ref 3.7–5.1)
Alk. phosphatase: 69 U/L (ref 35–117)
Anion gap: 13 (ref 10–22)
BUN: 24 mg/dL — ABNORMAL HIGH (ref 6–20)
Bilirubin, total: 1 mg/dL (ref <1.3)
CO2: 25 mmol/L (ref 19–31)
Calcium: 9.1 mg/dL (ref 8.8–10.5)
Chloride: 108 mmol/L (ref 98–110)
Creatinine: 0.7 mg/dL (ref 0.7–1.2)
Fasting, Y/N/Hrs: 13
GLOBCALC: 2.5 g/dL (ref 1.9–3.5)
Glucose: 99 mg/dL (ref 70–99)
Potassium: 4.5 mmol/L (ref 3.5–5.3)
Protein, total: 6.8 g/dL (ref 6.1–8.0)
Sodium: 142 mmol/L (ref 133–145)

## 2013-08-08 LAB — LP(A)-P (HDL ONLY)
Fasting, Y/N/Hrs: 13
Lp(a)-P: 147 nmol/L — ABNORMAL HIGH (ref <75)

## 2013-08-08 LAB — INSULIN (HDL ONLY)
Fasting, Y/N/Hrs: 13
Insulin: 9 uU/mL (ref 3–9)

## 2013-08-08 LAB — NT-PROBNP (HDL ONLY)
Fasting, Y/N/Hrs: 13
Pro BNP,NT: 102 pg/mL (ref <125)

## 2013-08-08 LAB — MYELOPEROXIDASE, AB (HDL ONLY)
Fasting, Y/N/Hrs: 13
MPOITA: 343 pmol/L — ABNORMAL HIGH (ref <321)

## 2013-08-08 LAB — URINE CREATININE (HDL ONLY)
Creatinine w AspirinWorks: 158 mg/dL (ref 20–400)
Fasting, Y/N/Hrs: 13

## 2013-08-08 LAB — APOLIPOPROTEIN B (HDL ONLY)
Apolipoprotein B: 57 mg/dL (ref <60)
Fasting, Y/N/Hrs: 13

## 2013-08-08 LAB — LDL-P HDL (HDL ONLY)
Fasting, Y/N/Hrs: 13
HDL HDL-P: 34.4 umol/L — ABNORMAL LOW (ref >38.0)
HDL LDL-P: 1090 nmol/L — ABNORMAL HIGH (ref <1020)
HDL SLDL-P: 672 nmol/L

## 2013-08-08 LAB — LIPOPROTEIN ASSOC PHOSPHOLIPID (HDL ONLY)
Fasting, Y/N/Hrs: 13
Lp-PLA2: 121 ng/mL (ref <200)

## 2013-08-08 LAB — VITAMIN D, 25 HYDROXY (HDL ONLY)
Fasting, Y/N/Hrs: 13
VITAMIN D, 25-HYDROXY: 28 ng/mL — ABNORMAL LOW (ref 30–100)

## 2013-08-09 LAB — STEROLS (HDL ONLY)
AA: 1
Campesterol: 2.45 ug/mL (ref 2.11–4.43)
Cholestanol: 2.24 ug/mL (ref 2.02–3.47)
Desmosterol: 0.65 ug/mL (ref <1.28)
Fasting, Y/N/Hrs: 13
Sitosterol: 2.26 ug/mL (ref 1.43–3.17)

## 2013-08-10 LAB — BLOOD FATTY ACIDS PROFILE (HDL ONLY)
Alpha linoleic acid n3: <0.1 % (ref 0.1–0.4)
Cis-mono-unsaturated fatty acid total: 16.6 (ref 11.5–20.5)
Docosapentaenoic acid n3: 2.623 % (ref 0.600–4.100)
Docosapentaenoic acid n6: 0.89 % (ref 0.100–1.300)
Fasting, Y/N/Hrs: 13
Omega-3 Fatty Acid total: 7.8 (ref 0.1–14.1)
Omega-3 index: 5.1 (ref 0.1–10.4)
Omega-6 Fatty Acid total: 34.2 (ref 28.6–44.5)
Saturated fatty acid total: 40.3 (ref 36.6–42.0)
Trans fatty acid total: 1.1 (ref 0.1–1.8)
Translinoleic acid: 0.189 % (ref 0.100–0.500)
Transoleic acid: 0.664 % (ref 0.100–1.300)

## 2013-08-10 LAB — ASPIRINWORKS(11-DEHYDRO-THROMBOXANE B2)(HDL ONLY)
11-Dehydro-Thromboxane B2: 456 pg/mL (ref <1501)
Fasting, Y/N/Hrs: 13

## 2013-08-18 NOTE — Progress Notes (Signed)
HDL results from 07/28/13        Lab test High risk Intermediate risk  Optimal   TC   122   LDL   79   HDL   44   TG   90   Non-HDL   79   Apo-B   57   LDL-p  1090    %sdLDL   23   HDL2   12   Lp(a) chol      Myeloperoxidase  343    hs-CRP   0.3   INT-proBNP   102   Aspirin Works   456   ApoE genotype      CYP2C19      Insulin   9   Hgb A1c      Est avg glucose      Glucose   99   Vitamin D  28    campesterol   2.45   campesterol ratio      sitosterol   2.26   Sitosterol ratio      Cholestanol    2.24   Cholestanol ratio      desmosterol   0.65   Desmosterol ratio      HS Omega 3 index  5.1

## 2013-08-21 NOTE — Patient Instructions (Signed)
Please start taking 2000 IU Vitamin D3 every day.    Please start taking 1 gram (1000 mg) of EPA and DHA fish oil daily.    Fish Oil    In recent research, fish oil has been found to have many health benefits:   *Lowers triglycerides     *Increases HDL (the "good" cholesterol)   *Decreases inflammation (arthritis, diabetes, and high blood pressure, for example)   *Improves brain function   *Decreases depression and anxiety    For these reasons, I may ask you to take a fish oil supplement (if you're not already eating three servings of "oily" fish  per week).  However, because there are many fish oil supplements on the market, it can be difficult to figure out which ones to purchase and take!    There are two "active ingredients" in fish oil:  DHA and EPA.  The amounts of these vary tremendously in supplements, so you need to read the fine print to find the ones that have more of the active ingredients in them.  You want the highest concentration of EPA and DHA per capsule.  If you add up the amount of EPA and DHA, you'll be able to compare supplements.    For example, in a 1-gram capsule, EPA+DHA might total 300 mg.  This means that about 30% of the supplement contains active ingredients.  Another brand might have EPA+DHA=720 mg in a 1-gram capsule, so it contains 72% active ingredients.  You want to get the highest concentration of active ingredients possible.    There is a prescription-grade fish oil called Lovaza.  You might want to compare the cost of this with the cost of over-the-counter supplements.  Lovaza has 900 mg of EPA+DHA per 1-gram pill, so it's very concentrated.    Finally, if fish oil capsules give you "the fishy burps," you may want to put them in the freezer and take them from there.  By the time they've warmed up enough to cause problems, they've passed your duodenum, so are less bothersome.  If that doesn't work, Lovaza or enteric-coated capsules usually work well.    Fish oil brands you might  consider:    Dietary Supplements:  Brand        Caps needed for ~1 gm  GNC Triple Strength Fish Oil (GNC)      2  Nordic Naturals Pro Omega??       2  OmegaGenicsTM EPA-DHA 720 (Metagenics??)     2   [employer] SignatureTM Natural Fish Oil ([employer]??)      3  Member???s Mark?? Omega-3 Fish Oil ([employer]??)    3   Natural Fish Oil Concentrate ([facility]??)     3   Enteric Coated Omega-3 (Vitabase)      3  Nordic Naturals Artic Omega??       3  Natural Fish Oil [employer] Aid??)        4  Fish Oil Concentrate ([facility])       4  Nature???s Bounty?? Salmon Oil       5  VitalOils1000TM (VitalRemedyMD)       1    Pharmaceuticals  Omega-3 Acid ethyl esters (Lovaza??, GSK)     1      Margee Emmani Lesueur, WHNP, ANP    I would like for you to start a regular exercise program.  Walking is fine (without the dogs!).

## 2013-08-21 NOTE — Progress Notes (Signed)
HISTORY OF PRESENT ILLNESS  Edward Villarreal is a 67 y.o. male. Last seen 06/26/13.    Problem List Date Reviewed: 08/21/2013        ICD-9-CM Class Noted    Agatston CAC score, <100 793.2  02/05/2013        T2DM (type 2 diabetes mellitus) 250.00  03/07/2012        Sinus bradycardia 427.89  03/07/2012        Mixed hyperlipidemia 272.2  02/28/2011        CAD (coronary artery disease) 414.00  02/28/2011            Cardiac testing  CAC 09/27/2008 - 42.7  Exercise cardiolite 02/2011 - 12 min, normal perfusion, EF 59%    HPI  Feels good. No interval issues.  Eating better: not craving sugar.  No exertional sx. Plays golf regularly.  Patient denies any exertional chest pain, dyspnea, palpitations, syncope, orthopnea, edema or paroxysmal nocturnal dyspnea.    Takes ranitidine nightly with dinner. Takes propanolol prn golf tournaments for tremors.    Diet: Bfast:  Oatmeal with berries or eggs; Lunch: [place] sandwich on whole wheat; Snack:  Kashi bar; Dinner: soups, no beef, sm amount of pork, some chicken. Legumes, some fish.    Goes to [place] for 4-5 months per year.  More exercise, better diet there.    Patient reports appetite has surged.  Thinks it's due to the Zetia. Also reports not sleeping as much, but still feeling rested with 7 hours sleep nightly.    Cardiac risk factors   HTN no  DM  no  Smoking no  Family hx of CAD yes - father with CABG      Past Medical History   Diagnosis Date   ??? DJD (degenerative joint disease)      L4-L5, tx with PT and nerve blocks   ??? GERD (gastroesophageal reflux disease)    ??? Hyperlipidemia    ??? Sinus bradycardia    ??? Mixed hyperlipidemia    ??? IGT (impaired glucose tolerance)    ??? CAD (coronary artery disease)      CAC 42 Dec 2009       Current Outpatient Prescriptions   Medication Sig Dispense Refill   ??? aspirin delayed-release 81 mg tablet Take  by mouth daily.       ??? ezetimibe (ZETIA) 10 mg tablet Take 1 tablet by mouth daily.  28 tablet  0   ??? metFORMIN ER (GLUCOPHAGE XR) 500 mg tablet Take  3 tablets by mouth daily (with dinner).  270 tablet  3   ??? atorvastatin (LIPITOR) 20 mg tablet Take 1 tablet by mouth daily.  90 tablet  3   ??? propranolol (INDERAL) 80 mg tablet Take 40 mg by mouth as needed.       ??? ranitidine (ZANTAC) 75 mg tablet Take 150 mg by mouth as needed.         No Known Allergies    Married. Retired - Garment/textile technologist. Nonsmoker.     Review of Systems   Constitutional: Negative for fever, chills, malaise/fatigue and diaphoresis.   Respiratory: Negative for cough, hemoptysis, sputum production and wheezing.    Cardiovascular: Negative for chest pain, palpitations, orthopnea, claudication, leg swelling and PND.   Gastrointestinal: Negative for heartburn, nausea, vomiting, blood in stool and melena.   Genitourinary: Negative for dysuria and flank pain.   Musculoskeletal: Positive for back pain. Negative for joint pain.   Skin: Negative for rash.  Neurological: Positive for dizziness. Negative for focal weakness, seizures, loss of consciousness, weakness and headaches.   Endo/Heme/Allergies: Does not bruise/bleed easily.   Psychiatric/Behavioral: Negative for memory loss. The patient does not have insomnia.        BP 124/72   Pulse 52   Resp 20   Ht 5' 5.2" (1.656 m)   Wt 163 lb 3.2 oz (74.027 kg)   BMI 26.99 kg/m2   SpO2 98%  Wt Readings from Last 3 Encounters:   08/21/13 163 lb 3.2 oz (74.027 kg)   06/26/13 165 lb (74.844 kg)   04/19/13 169 lb 12.8 oz (77.021 kg)     Physical Exam   Vitals reviewed.  Constitutional: He is oriented to person, place, and time. He appears well-developed.   Neck: Neck supple. No JVD present.   Cardiovascular: Normal rate, regular rhythm and normal heart sounds.  Exam reveals no gallop and no friction rub.    No murmur heard.  Pulses:       Carotid pulses are 2+ on the right side, and 2+ on the left side.       Dorsalis pedis pulses are 2+ on the right side, and 2+ on the left side.   Pulmonary/Chest: Effort normal and breath sounds normal. He has no wheezes.  He has no rales.   Abdominal: Soft. Bowel sounds are normal.   Musculoskeletal: He exhibits no edema.   Neurological: He is alert and oriented to person, place, and time.   Skin: Skin is warm and dry.   Psychiatric: He has a normal mood and affect.     HDL results from 07/28/13   Lab test  High risk  Intermediate risk  Optimal    TC    122    LDL    79    HDL    44    TG    90    Non-HDL    79    Apo-B    57    LDL-p   1090     %sdLDL    23    HDL2    12    Lp(a) chol       Myeloperoxidase   343     hs-CRP    0.3    INT-proBNP    102    Aspirin Works    456    ApoE genotype       CYP2C19       Insulin    9    Hgb A1c       Est avg glucose       Glucose    99    Vitamin D   28     campesterol    2.45    campesterol ratio       sitosterol    2.26    Sitosterol ratio       Cholestanol    2.24    Cholestanol ratio       desmosterol    0.65    Desmosterol ratio       HS Omega 3 index   5.1       ASSESSMENT and PLAN  Encounter Diagnoses   Name Primary?   ??? CAD (coronary artery disease) Yes   ??? Mixed hyperlipidemia    ??? T2DM (type 2 diabetes mellitus)    ??? Sinus bradycardia    ??? Agatston CAC score, <100      No sx of angina or bradycardia.  Patient's LDL-P shows significant improvement with the addition of Zetia.  Continue it with atorvastatin.    Encouraged patient to begin a regular exercise program, whether he's in [place] or here.    HgA1C not drawn with these labs; patient to continue with current 1500 mg of metformin daily.    Vitamin D level and Omega 3 index are low.  Advised patient to add Vitamin D3 2000 IU daily and 1 g EPA and DHA fish oil daily; resources given.    The patient was encouraged to continue current medications, exercise, lose weight and call with any new complaints or concerns.    Follow-up Disposition:  Return in about 6 months (around 02/18/2014).  HDL labs prior with HgA1C      Cheri Fowler, WHNP, ANP

## 2014-02-19 LAB — MYELOPEROXIDASE, AB (HDL ONLY)
Fasting, Y/N/Hrs: 11
MPOITA: 251 pmol/L (ref <321)

## 2014-02-19 LAB — VITAMIN D, 25 HYDROXY (HDL ONLY)
Fasting, Y/N/Hrs: 11
VITAMIN D, 25-HYDROXY: 49 ng/mL (ref 30–100)

## 2014-02-19 LAB — URINE CREATININE (HDL ONLY)
Creatinine w AspirinWorks: 220 mg/dL (ref 20–400)
Fasting, Y/N/Hrs: 11

## 2014-02-19 LAB — HEMOGLOBIN A1C (HDL ONLY)
Estimated Average Glucose: 116.9 mg/dL — ABNORMAL HIGH (ref <116.9)
Fasting, Y/N/Hrs: 11
HGBA1C-T: 5.7 % — ABNORMAL HIGH (ref <5.7)

## 2014-02-20 LAB — ASPIRINWORKS(11-DEHYDRO-THROMBOXANE B2)(HDL ONLY)
11-Dehydro-Thromboxane B2: 590 pg/mL (ref <1501)
Fasting, Y/N/Hrs: 11

## 2014-02-20 LAB — CRP, HIGH SENSITIVITY
CRP, High sensitivity: 0.4 mg/L (ref <1.0)
Fasting, Y/N/Hrs: 11

## 2014-02-20 LAB — METABOLIC PANEL, COMPREHENSIVE (HDL ONLY)
% ALBUMIN: 63 % (ref 54–71)
ALB/GLOBRATIO: 1.72 (ref 1.15–2.50)
ALT (SGPT): 18 U/L (ref <42)
AST (SGOT): 17 U/L (ref <41)
Albumin: 4.3 g/dL (ref 3.7–5.1)
Alk. phosphatase: 59 U/L (ref 35–117)
Anion gap: 8 (ref 6–18)
BUN: 25 mg/dL — ABNORMAL HIGH (ref 6–20)
Bilirubin, total: 1.4 mg/dL — ABNORMAL HIGH (ref <1.3)
CO2: 27 mmol/L (ref 19–31)
Calcium: 9.2 mg/dL (ref 8.8–10.5)
Chloride: 108 mmol/L (ref 98–110)
Creatinine: 0.9 mg/dL (ref 0.7–1.2)
Fasting, Y/N/Hrs: 11
GLOBCALC: 2.5 g/dL (ref 1.9–3.5)
Glucose: 102 mg/dL — ABNORMAL HIGH (ref 70–99)
Potassium: 4.7 mmol/L (ref 3.5–5.3)
Protein, total: 6.7 g/dL (ref 6.1–8.0)
Sodium: 143 mmol/L (ref 133–145)

## 2014-02-20 LAB — LIPID PANEL (HDL ONLY)
Cholesterol, total: 105 mg/dL (ref <200)
Fasting, Y/N/Hrs: 11
HDL Cholesterol: 43 mg/dL (ref >39)
LDL, calculated: 54 mg/dL (ref <100)
Non-HDL Cholesterol: 63 mg/dL (ref <130)
Triglyceride: 63 mg/dL (ref <150)

## 2014-02-20 LAB — HDL 2 SUBCLASS (HDL ONLY)
Fasting, Y/N/Hrs: 11
HDL 2 subclass: 11 mg/dL — ABNORMAL LOW (ref >11)

## 2014-02-20 LAB — SMALL DENSE LDL (HDL ONLY)
% sdLDL (calculated): 23 (ref <26)
Fasting, Y/N/Hrs: 11
Small Dense LDL: 13 mg/dL (ref <21)

## 2014-02-20 LAB — NT-PROBNP (HDL ONLY)
Fasting, Y/N/Hrs: 11
Pro BNP,NT: 69 pg/mL (ref <125)

## 2014-02-20 LAB — FATTY ACIDS, FREE (HDL ONLY)
FFA free fatty acids: 0.59 mmol/L (ref <0.60)
Fasting, Y/N/Hrs: 11

## 2014-02-20 LAB — HOMOCYSTEINE (HDL ONLY)
Fasting, Y/N/Hrs: 11
Homocysteine, plasma: 9 umol/L (ref <11)

## 2014-02-20 LAB — APOLIPOPROTEIN B (HDL ONLY)
Apolipoprotein B: 53 mg/dL (ref <60)
Fasting, Y/N/Hrs: 11

## 2014-02-20 LAB — LDL-P HDL (HDL ONLY)
Fasting, Y/N/Hrs: 11
HDL HDL-P: 32.9 umol/L — ABNORMAL LOW (ref >38.0)
HDL LDL-P: 824 nmol/L (ref <1020)
HDL SLDL-P: 525 nmol/L — ABNORMAL HIGH (ref <501)

## 2014-02-20 LAB — LP(A)-P (HDL ONLY)
Fasting, Y/N/Hrs: 11
Lp(a)-P: 164 nmol/L — ABNORMAL HIGH (ref <75)

## 2014-02-20 LAB — LIPOPROTEIN ASSOC PHOSPHOLIPID (HDL ONLY)
Fasting, Y/N/Hrs: 11
Lp-PLA2: 166 ng/mL (ref <200)

## 2014-02-20 LAB — INSULIN (HDL ONLY)
Fasting, Y/N/Hrs: 11
Insulin: 10 uU/mL — ABNORMAL HIGH (ref 3–9)

## 2014-02-20 LAB — APOLIPOPROTEIN A-1 (HDL ONLY)
Apo B: Apo A1 Ratio: 0.44 — ABNORMAL LOW (ref 0.61–0.80)
Apolipoprotein A-1: 120 mg/dL — ABNORMAL LOW (ref >131)
Fasting, Y/N/Hrs: 11

## 2014-02-21 LAB — STEROLS (HDL ONLY)
Campesterol Ratio: 210 (ref 115–240)
Campesterol: 2.29 ug/mL (ref 2.11–4.43)
Cholestanol Ratio: 235 — ABNORMAL HIGH (ref 117–194)
Cholestanol: 2.48 ug/mL (ref 2.02–3.47)
Desmosterol: <0.5 ug/mL — ABNORMAL LOW (ref 0.50–1.27)
Fasting, Y/N/Hrs: 11
Sitosterol Ratio: 184 — ABNORMAL HIGH (ref 76–168)
Sitosterol: 2.07 ug/mL (ref 1.43–3.17)

## 2014-02-21 LAB — BLOOD FATTY ACIDS PROFILE (HDL ONLY)
Alpha linoleic acid n3: <0.1 % (ref 0.1–0.4)
Cis-mono-unsaturated fatty acid total: 15.4 (ref 11.5–20.5)
Docosapentaenoic acid n3: 3.531 % (ref 0.600–4.100)
Docosapentaenoic acid n6: 0.625 % (ref 0.100–1.300)
Fasting, Y/N/Hrs: 11
Omega-3 Fatty Acid total: 11.5 (ref 0.1–14.1)
Omega-3 index: 7.8 (ref 0.1–10.4)
Omega-6 Fatty Acid total: 32.2 (ref 28.6–44.5)
Saturated fatty acid total: 40.1 (ref 36.6–42.0)
Trans fatty acid total: 0.8 (ref 0.1–1.8)
Translinoleic acid: <0.1 % (ref 0.1–0.5)
Transoleic acid: 0.563 % (ref 0.100–1.300)

## 2014-02-24 NOTE — Progress Notes (Signed)
Quick Note:    Will review at visit.  ______

## 2014-03-02 MED ORDER — METFORMIN SR 500 MG 24 HR TABLET
500 mg | ORAL_TABLET | Freq: Every day | ORAL | Status: DC
Start: 2014-03-02 — End: 2015-03-15

## 2014-03-02 MED ORDER — EZETIMIBE 10 MG TAB
10 mg | ORAL_TABLET | Freq: Every day | ORAL | Status: DC
Start: 2014-03-02 — End: 2014-06-02

## 2014-03-02 MED ORDER — ATORVASTATIN 20 MG TAB
20 mg | ORAL_TABLET | Freq: Every day | ORAL | Status: DC
Start: 2014-03-02 — End: 2015-03-15

## 2014-03-02 NOTE — Progress Notes (Signed)
HISTORY OF PRESENT ILLNESS  Edward Villarreal is a 68 y.o. male. Last seen 08/21/13.    Problem List Date Reviewed: 03/02/2014        ICD-9-CM Class Noted    Agatston CAC score, <100 793.2  02/05/2013        T2DM (type 2 diabetes mellitus) (HCC) 250.00  03/07/2012        Sinus bradycardia 427.89  03/07/2012        Mixed hyperlipidemia 272.2  02/28/2011        CAD (coronary artery disease) 414.00  02/28/2011            Cardiac testing  CAC 09/27/2008 - 42.7  Exercise cardiolite 02/2011 - 12 min, normal perfusion, EF 59%    HPI  Feels good. No interval issues.  Still loves sugar.  No exertional sx. Patient denies any exertional chest pain, dyspnea, palpitations, syncope, orthopnea, edema or paroxysmal nocturnal dyspnea.    Takes ranitidine nightly with dinner. Takes propanolol prn golf tournaments for tremors. Plays golf occasionally.    Goes to [place] for 4-5 months per year.  Doesn't exercise because "it's boring." Is active.    Reports continued "GI effects" with Zetia, but tolerable. Also reports not sleeping as much, but still feeling rested with 7 hours sleep nightly.    Cardiac risk factors   HTN no  DM  no  Smoking no  Family hx of CAD yes - father with CABG      Past Medical History   Diagnosis Date   ??? DJD (degenerative joint disease)      L4-L5, tx with PT and nerve blocks   ??? GERD (gastroesophageal reflux disease)    ??? Hyperlipidemia    ??? Sinus bradycardia    ??? Mixed hyperlipidemia    ??? IGT (impaired glucose tolerance)    ??? CAD (coronary artery disease)      CAC 42 Dec 2009       Current Outpatient Prescriptions   Medication Sig Dispense Refill   ??? DOCOSAHEXANOIC ACID/EPA (FISH OIL PO) Take  by mouth three (3) times daily.       ??? aspirin delayed-release 81 mg tablet Take  by mouth daily.       ??? ezetimibe (ZETIA) 10 mg tablet Take 1 tablet by mouth daily.  28 tablet  0   ??? metFORMIN ER (GLUCOPHAGE XR) 500 mg tablet Take 3 tablets by mouth daily (with dinner).  270 tablet  3   ??? atorvastatin (LIPITOR) 20 mg tablet  Take 1 tablet by mouth daily.  90 tablet  3   ??? propranolol (INDERAL) 80 mg tablet Take 40 mg by mouth as needed.       ??? ranitidine (ZANTAC) 75 mg tablet Take 150 mg by mouth as needed.         Allergies   Allergen Reactions   ??? Other Plant, Educational psychologist, Environmental Anaphylaxis     Fire ants       Married. Retired - Garment/textile technologist. Nonsmoker.     Review of Systems   Constitutional: Negative for fever, chills, malaise/fatigue and diaphoresis.   Respiratory: Negative for cough, hemoptysis, sputum production and wheezing.    Cardiovascular: Negative for chest pain, palpitations, orthopnea, claudication, leg swelling and PND.   Gastrointestinal: Negative for heartburn, nausea, vomiting, blood in stool and melena.   Genitourinary: Negative for dysuria and flank pain.   Musculoskeletal: Positive for back pain. Negative for joint pain.   Skin: Negative for rash.  Neurological: Negative for dizziness, focal weakness, seizures, loss of consciousness, weakness and headaches.   Endo/Heme/Allergies: Does not bruise/bleed easily.   Psychiatric/Behavioral: Negative for memory loss. The patient does not have insomnia.        BP 130/72   Pulse 55   Resp 18   Ht 5' 5.2" (1.656 m)   Wt 172 lb 12.8 oz (78.382 kg)   BMI 28.58 kg/m2   SpO2 97%  Wt Readings from Last 3 Encounters:   03/02/14 172 lb 12.8 oz (78.382 kg)   08/21/13 163 lb 3.2 oz (74.027 kg)   06/26/13 165 lb (74.844 kg)     Physical Exam   Vitals reviewed.  Constitutional: He is oriented to person, place, and time. He appears well-developed.   Neck: Neck supple. No JVD present.   Cardiovascular: Normal rate, regular rhythm and normal heart sounds.  Exam reveals no gallop and no friction rub.    No murmur heard.  Pulses:       Carotid pulses are 2+ on the right side, and 2+ on the left side.       Dorsalis pedis pulses are 2+ on the right side, and 2+ on the left side.   Pulmonary/Chest: Effort normal and breath sounds normal. He has no wheezes. He has no rales.    Abdominal: Soft. Bowel sounds are normal.   Musculoskeletal: He exhibits no edema.   Neurological: He is alert and oriented to person, place, and time.   Skin: Skin is warm and dry.   Psychiatric: He has a normal mood and affect.     HDL results from 02/19/14    Lab test  High risk  Intermediate risk  Optimal    TC    105    LDL    54    HDL    43    TG    63    Non-HDL    63    Apo-B    53    LDL-p    824    %sdLDL       HDL2   11     Lp(a) chol       Myeloperoxidase    251    hs-CRP    0.4    INT-proBNP    69    Aspirin Works    590    ApoE genotype       CYP2C19       Insulin   10     Hgb A1c   5.7     Est avg glucose   116.9     Glucose   102     Vitamin D    49    campesterol    2.29    campesterol ratio       sitosterol    2.07    Sitosterol ratio       Cholestanol    2.48    Cholestanol ratio       desmosterol    <0.50 hypo    Desmosterol ratio       HS Omega 3 index   7.8       EKG:  Sinus brady at 55 bpm, normal  ASSESSMENT and PLAN  Encounter Diagnoses   Name Primary?   ??? Coronary artery disease involving native coronary artery without angina pectoris Yes   ??? Mixed hyperlipidemia    ??? Sinus bradycardia    ??? Agatston CAC score, <100    ???  Insulin resistance      No symptoms of angina or bradycardia.     Patient's LDL-P now optimized with Zetia and atorvastatin. Insulin resistance and HgA1C well-controlled on current dose of metformin, so will continue.    Again encouraged patient to begin a regular exercise program. He was advised again to limit his simple carb intake.    Vitamin D level and Omega 3 index are much improved.  Advised patient to continue Vitamin D3 2000 IU daily and 1 g EPA and DHA fish oil daily; resources given.    The patient was encouraged to continue current medications and call with any new complaints or concerns.    Follow-up Disposition:  Return in about 6 months (around 09/02/2014). with Dr. Milbert CoulterKapadia.  HDL labs prior with HgA1C      Cheri FowlerMargaret R. Keo Schirmer, WHNP, ANP

## 2014-03-02 NOTE — Progress Notes (Signed)
HDL results from 02/19/14        Lab test High risk Intermediate risk  Optimal   TC   105   LDL   54   HDL   43   TG   63   Non-HDL   63   Apo-B   53   LDL-p   824   %sdLDL      HDL2  11    Lp(a) chol      Myeloperoxidase   251   hs-CRP   0.4   INT-proBNP   69   Aspirin Works   590   ApoE genotype      CYP2C19      Insulin  10    Hgb A1c  5.7    Est avg glucose  116.9    Glucose  102    Vitamin D   49   campesterol   2.29   campesterol ratio      sitosterol   2.07   Sitosterol ratio      Cholestanol    2.48   Cholestanol ratio      desmosterol   <0.50 hypo   Desmosterol ratio      HS Omega 3 index  7.8

## 2014-05-07 NOTE — Progress Notes (Signed)
HISTORY OF PRESENT ILLNESS  Edward Villarreal is a 68 y.o. male. Last seen April 2014. Here for cardiac clearance prior to bilateral cataract surgery.     Problem List Date Reviewed: 03/02/2014        ICD-9-CM Class Noted    Dyslipidemia 272.4  05/07/2014        Agatston CAC score, <100 793.2  02/05/2013        T2DM (type 2 diabetes mellitus) (HCC) 250.00  03/07/2012        Sinus bradycardia 427.89  03/07/2012        CAD (coronary artery disease) 414.00  02/28/2011            Cardiac testing  CAC 09/27/2008 - 42.7  Exercise cardiolite 02/2011 - 12 min, normal perfusion, EF 59%      HPI  Feels excellent overall. Active in walking, golf, and going to the gym. No exertional sxs. Patient denies any exertional chest pain, dyspnea, palpitations, syncope, orthopnea, edema or paroxysmal nocturnal dyspnea.    Scheduled for bilateral cataract surgery with Dr. Koren Bound in the near future.     Cardiac risk factors   HTN no  DM  no  Smoking no  Family hx of CAD yes - father with CABG      Past Medical History   Diagnosis Date   ??? DJD (degenerative joint disease)      L4-L5, tx with PT and nerve blocks   ??? GERD (gastroesophageal reflux disease)    ??? Hyperlipidemia    ??? Sinus bradycardia    ??? Mixed hyperlipidemia    ??? IGT (impaired glucose tolerance)    ??? CAD (coronary artery disease)      CAC 42 Dec 2009       Current Outpatient Prescriptions   Medication Sig Dispense Refill   ??? DOCOSAHEXANOIC ACID/EPA (FISH OIL PO) Take  by mouth three (3) times daily.     ??? ezetimibe (ZETIA) 10 mg tablet Take 1 Tab by mouth daily. 90 Tab 3   ??? metFORMIN ER (GLUCOPHAGE XR) 500 mg tablet Take 3 Tabs by mouth daily (with dinner). 270 Tab 3   ??? atorvastatin (LIPITOR) 20 mg tablet Take 1 Tab by mouth daily. 90 Tab 3   ??? aspirin delayed-release 81 mg tablet Take  by mouth daily.     ??? propranolol (INDERAL) 80 mg tablet Take 40 mg by mouth as needed.     ??? ranitidine (ZANTAC) 75 mg tablet Take 150 mg by mouth as needed.       Allergies   Allergen Reactions    ??? Other Plant, Educational psychologist, Environmental Anaphylaxis     Fire ants       Married. Retired - Garment/textile technologist. Nonsmoker.     Review of Systems   Constitutional: Negative for fever, chills, malaise/fatigue and diaphoresis.   Respiratory: Negative for cough, hemoptysis, sputum production and wheezing.    Cardiovascular: Negative for chest pain, palpitations, orthopnea, claudication, leg swelling and PND.   Gastrointestinal: Negative for heartburn, nausea, vomiting, blood in stool and melena.   Genitourinary: Negative for dysuria and flank pain.   Musculoskeletal: Positive for back pain. Negative for joint pain.   Skin: Negative for rash.   Neurological: Positive for dizziness. Negative for focal weakness, seizures, loss of consciousness, weakness and headaches.   Endo/Heme/Allergies: Does not bruise/bleed easily.   Psychiatric/Behavioral: Negative for memory loss. The patient does not have insomnia.        BP 118/62 mmHg  Pulse 60   Resp 18   Ht 5\' 2"  (1.575 m)   Wt 171 lb (77.565 kg)   BMI 31.27 kg/m2   SpO2 98%  Wt Readings from Last 3 Encounters:   05/07/14 171 lb (77.565 kg)   03/02/14 172 lb 12.8 oz (78.382 kg)   08/21/13 163 lb 3.2 oz (74.027 kg)     Physical Exam   Vitals reviewed.  Constitutional: He is oriented to person, place, and time. He appears well-developed.   Neck: Neck supple. No JVD present.   Cardiovascular: Normal rate, regular rhythm and normal heart sounds.  Exam reveals no gallop and no friction rub.    No murmur heard.  Pulses:       Carotid pulses are 2+ on the right side, and 2+ on the left side.       Dorsalis pedis pulses are 2+ on the right side, and 2+ on the left side.   Pulmonary/Chest: Effort normal and breath sounds normal. He has no wheezes. He has no rales.   Abdominal: Soft. Bowel sounds are normal.   Musculoskeletal: He exhibits no edema.   Neurological: He is alert and oriented to person, place, and time.   Skin: Skin is warm and dry.    Psychiatric: He has a normal mood and affect.     EKG today - SB 44 otherwise normal    Lipids March 2012 TC 139 LDL 69 HDL 39 TG 128  FBS 161108  NMR lipids 01/28/12 - LDLp 1185, FBS 126  HDL lab results from 05/20/12:   Lab test  High risk  Intermediate risk  Optimal    TC    128    LDL    79    HDL    41    TG    88    Non-HDL    87    Apo-B   62     LDL-p    875    %sdLDL    24    HDL2  8      Lp(a) chol  154      Myeloperoxidase    267    hs-CRP    <0.3    INT-proBNP    62    Aspirin Works    1085    ApoE genotype    3/3    CYP2C19    *1/*1    Insulin    5    Glucose    92    Vitamin D    55    campesterol   4.04 optimal     Campesterol ratio  305 hyper      sitosterol   3.11 optimal     Sitosterol ratio  227 hyper      cholestanol   3.12 optimal     Cholestanol ratio  242 hyper      desmosterol    0.40 hypo    Desmosterol ratio   31     HS Omega 3 index   6.0       HDL results from 01/23/13   Lab test  High risk  Intermediate risk  Optimal    TC    139    LDL    79    HDL    47    TG   154     Non-HDL    92    Apo-B   70     LDL-p    899    %  sdLDL   29     HDL2    14    Lp(a) chol       Myeloperoxidase    217    hs-CRP    <0.3    INT-proBNP    47    Aspirin Works    500    ApoE genotype       CYP2C19       Insulin  14      Glucose    96    Vitamin D    46    campesterol  5.22 hyper      Campesterol ratio       sitosterol  3.62 hyper      Sitosterol ratio       cholestanol    3.15    Cholestanol ratio       desmosterol    0.57    Desmosterol ratio       HS Omega 3 index   4.9     EKG today - sinus 48, normal    EKG 05/07/14 - sinus 58, normal    ASSESSMENT and PLAN  Encounter Diagnoses   Name Primary?   ??? Coronary artery disease involving native coronary artery without angina pectoris Yes   ??? Sinus bradycardia    ??? Dyslipidemia    ??? Type 2 diabetes mellitus without complication (HCC)    ??? Agatston CAC score, <100      Mr. Herst has subclinical CAD by ct heart scan 2009 (43). Stress nuclear  study 2012 was normal. No sx of angina at a good functional capacity.     He is at low cardiac risk for cardiac surgery. No special precautions are necessary.     Update HDL labs in September and then see Margee Krebs. See me in a year.     The patient was encouraged to continue current medications, exercise, lose weight and call with any new complaints or concerns.    Follow-up Disposition: Not on File  Margee in September, HDL labs prior    Written by Meredith Modyhristina Lee, scribe, as dictated by Doreene AdasShaival Humberto Addo, MD  Rhona LeavensShaival J Viridiana Spaid, MD

## 2014-05-24 NOTE — Telephone Encounter (Signed)
Returned pt phone call. Pt informed that he is to have HDLs drawn at least 2 to 3 weeks prior to follow appt on September 21st. Home address verified. Lab slip mailed to home address.

## 2014-05-24 NOTE — Telephone Encounter (Signed)
Pt calling to see if he need to have blood taken for his sept 21st appt. He can be reached at [phone]. [employer]

## 2014-06-02 ENCOUNTER — Encounter

## 2014-06-02 MED ORDER — EZETIMIBE 10 MG TAB
10 mg | ORAL_TABLET | Freq: Every day | ORAL | Status: DC
Start: 2014-06-02 — End: 2014-06-03

## 2014-06-02 NOTE — Telephone Encounter (Signed)
Pt would like to pick up a hard copy of the refill order. Please call back to [phone].

## 2014-06-02 NOTE — Telephone Encounter (Signed)
Requested Prescriptions     Signed Prescriptions Disp Refills   ??? ezetimibe (ZETIA) 10 mg tablet 90 Tab 3     Sig: Take 1 Tab by mouth daily.     Authorizing Provider: Doreene AdasKAPADIA, SHAIVAL     Ordering User: Wyona AlmasPOSTON, TANESHA S.

## 2014-06-03 ENCOUNTER — Encounter

## 2014-06-03 MED ORDER — EZETIMIBE 10 MG TAB
10 mg | ORAL_TABLET | Freq: Every day | ORAL | Status: DC
Start: 2014-06-03 — End: 2014-07-07

## 2014-06-03 NOTE — Telephone Encounter (Signed)
Patient requesting printed copy. He states [place] is going to [place] and can get this cheaper there. Per Md, refill approved.

## 2014-06-23 LAB — LIPID PANEL (HDL ONLY)
Cholesterol, total: 142 mg/dL (ref <200)
Fasting, Y/N/Hrs: 12
HDL Cholesterol: 47 mg/dL (ref >39)
LDL, calculated: 83 mg/dL (ref <100)
Non-HDL Cholesterol: 95 mg/dL (ref <130)
Triglyceride: 75 mg/dL (ref <150)

## 2014-06-23 LAB — APOLIPOPROTEIN A-1 (HDL ONLY)
Apo B: Apo A1 Ratio: 0.43 — ABNORMAL LOW (ref 0.61–0.80)
Apolipoprotein A-1: 142 mg/dL (ref >131)
Fasting, Y/N/Hrs: 12

## 2014-06-23 LAB — URINE CREATININE (HDL ONLY)
Creatinine w AspirinWorks: 261 mg/dL (ref 20–400)
Fasting, Y/N/Hrs: 12

## 2014-06-23 LAB — SMALL DENSE LDL (HDL ONLY)
% sdLDL (calculated): 26 — ABNORMAL HIGH (ref <26)
Fasting, Y/N/Hrs: 12
Small Dense LDL: 21 mg/dL — ABNORMAL HIGH (ref <21)

## 2014-06-23 LAB — FATTY ACIDS, FREE (HDL ONLY)
FFA free fatty acids: 0.52 mmol/L (ref <0.60)
Fasting, Y/N/Hrs: 12

## 2014-06-23 LAB — HOMOCYSTEINE (HDL ONLY)
Fasting, Y/N/Hrs: 12
Homocysteine, plasma: 12 umol/L — ABNORMAL HIGH (ref <11)

## 2014-06-23 LAB — HDL 2 SUBCLASS (HDL ONLY)
Fasting, Y/N/Hrs: 12
HDL 2 subclass: 9 mg/dL — ABNORMAL LOW (ref >11)

## 2014-06-24 LAB — METABOLIC PANEL, COMPREHENSIVE (HDL ONLY)
% ALBUMIN: 63 % (ref 54–71)
ALB/GLOBRATIO: 1.74 (ref 1.15–2.50)
ALT (SGPT): 17 U/L (ref <42)
AST (SGOT): 18 U/L (ref <41)
Albumin: 4.5 g/dL (ref 3.7–5.1)
Alk. phosphatase: 68 U/L (ref 35–117)
Anion gap: 10 (ref 6–18)
BUN: 26 mg/dL — ABNORMAL HIGH (ref 6–20)
Bilirubin, total: 0.7 mg/dL (ref <1.3)
CO2: 24 mmol/L (ref 19–31)
Calcium: 9.2 mg/dL (ref 8.8–10.5)
Chloride: 104 mmol/L (ref 98–110)
Creatinine: 0.9 mg/dL (ref 0.7–1.2)
Fasting, Y/N/Hrs: 12
GLOBCALC: 2.6 g/dL (ref 1.9–3.5)
Glucose: 99 mg/dL (ref 70–99)
Potassium: 4.5 mmol/L (ref 3.5–5.3)
Protein, total: 7.1 g/dL (ref 6.1–8.0)
Sodium: 139 mmol/L (ref 133–145)

## 2014-06-24 LAB — MYELOPEROXIDASE, AB (HDL ONLY)
Fasting, Y/N/Hrs: 12
MPOAU: 277 pmol/L — ABNORMAL HIGH (ref <256)

## 2014-06-24 LAB — NT-PROBNP (HDL ONLY)
Fasting, Y/N/Hrs: 12
Pro BNP,NT: 34 pg/mL (ref <125)

## 2014-06-24 LAB — LDL-P HDL (HDL ONLY)
Fasting, Y/N/Hrs: 12
HDL HDL-P: 37.6 umol/L — ABNORMAL LOW (ref >38.0)
HDL LDL-P: 999 nmol/L (ref <1020)
HDL SLDL-P: 560 nmol/L — ABNORMAL HIGH (ref <501)

## 2014-06-24 LAB — APOLIPOPROTEIN B (HDL ONLY)
Apolipoprotein B: 61 mg/dL — ABNORMAL HIGH (ref <60)
Fasting, Y/N/Hrs: 12

## 2014-06-24 LAB — LIPOPROTEIN ASSOC PHOSPHOLIPID (HDL ONLY)
Fasting, Y/N/Hrs: 12
Lp-PLA2: 159 ng/mL (ref <200)

## 2014-06-24 LAB — INSULIN (HDL ONLY)
Fasting, Y/N/Hrs: 12
Insulin: 13 uU/mL — ABNORMAL HIGH (ref 3–9)

## 2014-06-24 LAB — VITAMIN D, 25 HYDROXY (HDL ONLY)
Fasting, Y/N/Hrs: 12
VITAMIN D, 25-HYDROXY: 51 ng/mL (ref 30–100)

## 2014-06-24 LAB — LP(A)-P (HDL ONLY)
Fasting, Y/N/Hrs: 12
Lp(a)-P: 175 nmol/L — ABNORMAL HIGH (ref <75)

## 2014-06-24 LAB — CRP, HIGH SENSITIVITY
CRP, High sensitivity: <0.3 mg/L (ref <1.0)
Fasting, Y/N/Hrs: 12

## 2014-06-24 LAB — ASPIRINWORKS(11-DEHYDRO-THROMBOXANE B2)(HDL ONLY)
11-Dehydro-Thromboxane B2: 1720 pg/mL — ABNORMAL HIGH (ref <1501)
Fasting, Y/N/Hrs: 12

## 2014-06-25 LAB — BLOOD FATTY ACIDS PROFILE (HDL ONLY)
Alpha linoleic acid n3: 0.1 % (ref 0.100–0.400)
Cis-mono-unsaturated fatty acid total: 16.5 (ref 11.5–20.5)
Docosapentaenoic acid n3: 3.619 % (ref 0.600–4.100)
Docosapentaenoic acid n6: 0.526 % (ref 0.100–1.300)
Fasting, Y/N/Hrs: 12
Omega-3 Fatty Acid total: 12.4 (ref 0.1–14.1)
Omega-3 index: 8.6 (ref 0.1–10.4)
Omega-6 Fatty Acid total: 29.4 (ref 28.6–44.5)
Saturated fatty acid total: 40.9 (ref 36.6–42.0)
Trans fatty acid total: 0.8 (ref 0.1–1.8)
Translinoleic acid: 0.101 % (ref 0.100–0.500)
Transoleic acid: 0.56 % (ref 0.100–1.300)

## 2014-06-25 LAB — STEROLS (HDL ONLY)
Campesterol Ratio: 160 (ref 115–240)
Campesterol: 2.36 ug/mL (ref 2.11–4.43)
Cholestanol Ratio: 195 — ABNORMAL HIGH (ref 117–194)
Cholestanol: 2.79 ug/mL (ref 2.02–3.47)
Desmosterol Ratio: 38 (ref 31–64)
Desmosterol: 0.54 ug/mL (ref <1.28)
Fasting, Y/N/Hrs: 12
Sitosterol Ratio: 150 (ref 76–168)
Sitosterol: 2.29 ug/mL (ref 1.43–3.17)

## 2014-06-28 NOTE — Progress Notes (Signed)
Quick Note:        Will review at visit.    ______

## 2014-07-05 NOTE — Progress Notes (Signed)
Labs drawn 06/23/14       High Risk Intermediate Risk Optimal   Total Cholesterol   142   LDL   83   HDL   47   TG   75   Non-HDL   95   Apo B  61    LDL-P   999   Small LDL-P  560    SdLDL-C  21    Apo A-I   142   HDL-P  37.6    HDL2-C  9    Apo B: Apo A-I ratio   0.43   Lp (a)-P 175     Hs-CRP   <0.3   Lp-PLA   159   Myeloperoxidase  277    NT-proBNP   34   AspirinWorks 1720     Apolipoprotein E      [id number]*9*1*      CYP2C19*17*      Factor V Leiden      Prothrombin Mutation      Insulin 13     Free Fatty Acid   0.52   Glucose   99   HbA1c      Estimated Average Glucose      25-hydroxy-Vitamin D   51   Homocysteine  12    Creatinine, serum   0.9   Campesterol   2.36   Sitosterol   2.29   Cholestanol   2.79   Desmosterol   0.54   Omega 3   8.6

## 2014-07-07 MED ORDER — EZETIMIBE 10 MG TAB
10 mg | ORAL_TABLET | Freq: Every day | ORAL | Status: DC
Start: 2014-07-07 — End: 2014-12-02

## 2014-07-07 NOTE — Patient Instructions (Signed)
GOOD JOB cutting back on the pizza, ice cream and cookies!  Keep it up!    Please try to fall in love with (or at least tolerate) some more cardiovascular exercise.  This will help decrease your insulin number.  If your insulin level continues to go up, I'll need to increase your metformin.

## 2014-07-07 NOTE — Progress Notes (Signed)
HISTORY OF PRESENT ILLNESS  Edward Villarreal is a 68 y.o. male. Last seen 7/15 by Dr. Milbert Coulter for surgical clearance prior to bilateral cataract surgery.  Here today to discuss dyslipidemia.   Problem List  Date Reviewed: 07-08-14        Codes Class Noted    Insulin resistance ICD-9-CM:  277.7  ICD-10-CM:  E88.81  08-Jul-2014        Dyslipidemia ICD-9-CM:  272.4  ICD-10-CM:  E78.5  05/07/2014        Agatston CAC score, <100 ICD-9-CM:  793.2  ICD-10-CM:  R93.8  02/05/2013        Sinus bradycardia ICD-9-CM:  427.89  ICD-10-CM:  R00.1  03/07/2012        CAD (coronary artery disease) ICD-9-CM:  414.00  ICD-10-CM:  I25.10  02/28/2011            Cardiac testing  CAC 09/27/2008 - 42.7  Exercise cardiolite 02/2011 - 12 min, normal perfusion, EF 59%    HPI  Feels good. No interval issues.  Still loves sugar, but has cut back on his ice cream, cookies and pizza.  Now eating "a lot" of red grapes.  No exertional sx. Patient denies any exertional chest pain, dyspnea, palpitations, syncope, orthopnea, edema or paroxysmal nocturnal dyspnea.    Takes ranitidine nightly with dinner. Takes propanolol prn golf tournaments for tremors; has noticed tremors are a little more frequent. Plays golf 2-3 times per week. Is now walking his dog occasionally; having to do gluteal exercises.      Goes to [place] for 4-5 months per year.  Doesn't exercise because "it's boring." Is active.    Reports previous "GI effects" with Zetia have resolved. Also reports not sleeping as much, but still feeling rested with 7 hours sleep nightly.    Cardiac risk factors   HTN no  DM  no  Smoking no  Family hx of CAD yes - father with CABG      Past Medical History   Diagnosis Date   ??? DJD (degenerative joint disease)      L4-L5, tx with PT and nerve blocks   ??? GERD (gastroesophageal reflux disease)    ??? Hyperlipidemia    ??? Sinus bradycardia    ??? Mixed hyperlipidemia    ??? IGT (impaired glucose tolerance)    ??? CAD (coronary artery disease)      CAC 42 Dec 2009        Current Outpatient Prescriptions   Medication Sig Dispense Refill   ??? ezetimibe (ZETIA) 10 mg tablet Take 1 Tab by mouth daily. May dispense Brand or generic prescription. 90 Tab 3   ??? DOCOSAHEXANOIC ACID/EPA (FISH OIL PO) Take  by mouth three (3) times daily.     ??? metFORMIN ER (GLUCOPHAGE XR) 500 mg tablet Take 3 Tabs by mouth daily (with dinner). 270 Tab 3   ??? atorvastatin (LIPITOR) 20 mg tablet Take 1 Tab by mouth daily. 90 Tab 3   ??? aspirin delayed-release 81 mg tablet Take  by mouth daily.     ??? propranolol (INDERAL) 80 mg tablet Take 40 mg by mouth as needed.     ??? ranitidine (ZANTAC) 75 mg tablet Take 150 mg by mouth as needed.       Allergies   Allergen Reactions   ??? Other Plant, Educational psychologist, Environmental Anaphylaxis     Fire ants       Married. Retired - Garment/textile technologist. Nonsmoker.     Review  of Systems   Constitutional: Negative for fever, chills, malaise/fatigue and diaphoresis.   Respiratory: Negative for cough, hemoptysis, sputum production and wheezing.    Cardiovascular: Negative for chest pain, palpitations, orthopnea, claudication, leg swelling and PND.   Gastrointestinal: Negative for heartburn, nausea, vomiting, blood in stool and melena.   Genitourinary: Negative for dysuria and flank pain.   Musculoskeletal: Positive for back pain. Negative for joint pain.   Skin: Negative for rash.   Neurological: Negative for dizziness, focal weakness, seizures, loss of consciousness, weakness and headaches.   Endo/Heme/Allergies: Does not bruise/bleed easily.   Psychiatric/Behavioral: Negative for memory loss. The patient does not have insomnia.        BP 130/72 mmHg   Pulse 55   Resp 16   Ht  (1.575 m)   Wt 170 lb 9.6 oz (77.384 kg)   BMI 31.20 kg/m2   SpO2 99%  Wt Readings from Last 3 Encounters:   07/07/14 170 lb 9.6 oz (77.384 kg)   05/07/14 171 lb (77.565 kg)   03/02/14 172 lb 12.8 oz (78.382 kg)     Physical Exam   Vitals reviewed.   Constitutional: He is oriented to person, place, and time. He appears well-developed.   Neck: Neck supple. No JVD present.   Cardiovascular: Normal rate, regular rhythm and normal heart sounds.  Exam reveals no gallop and no friction rub.    No murmur heard.  Pulses:       Carotid pulses are 2+ on the right side, and 2+ on the left side.       Dorsalis pedis pulses are 2+ on the right side, and 2+ on the left side.   Pulmonary/Chest: Effort normal and breath sounds normal. He has no wheezes. He has no rales.   Abdominal: Soft. Bowel sounds are normal.   Musculoskeletal: He exhibits no edema.   Neurological: He is alert and oriented to person, place, and time.   Skin: Skin is warm and dry.   Psychiatric: He has a normal mood and affect.     Labs drawn 06/23/14     High Risk  Intermediate Risk  Optimal    Total Cholesterol    142    LDL    83    HDL    47    TG    75    Non-HDL    95    Apo B   61     LDL-P    999    Small LDL-P   560     SdLDL-C   21     Apo A-I    161    HDL-P   37.6     HDL2-C   9     Apo B: Apo A-I ratio    0.43    Lp (a)-P  175      Hs-CRP    <0.3    Lp-PLA    159    Myeloperoxidase   277     NT-proBNP    34    AspirinWorks  1720      Apolipoprotein E       [id number]*0*9*       CYP2C19*17*       Factor V Leiden       Prothrombin Mutation       Insulin  13      Free Fatty Acid    0.52    Glucose    99    HbA1c  Estimated Average Glucose       25-hydroxy-Vitamin D    51    Homocysteine   12     Creatinine, serum    0.9    Campesterol    2.36    Sitosterol    2.29    Cholestanol    2.79    Desmosterol    0.54    Omega 3    8.6      ASSESSMENT and PLAN  Encounter Diagnoses   Name Primary?   ??? Coronary artery disease involving native coronary artery without angina pectoris Yes   ??? Mixed hyperlipidemia    ??? Dyslipidemia    ??? Insulin resistance      No symptoms of angina or bradycardia. Patient's LDL-P remainsoptimized with Zetia and atorvastatin, but insulin resistance has risen, likely due  to his lack of structured exercise and a few too many simple carbs. Again encouraged patient to begin a regular exercise program. He was advised again to limit his simple carb intake. If no improvement, can consider increasing metformin dose.    Vitamin D level and Omega 3 index remain optimized; continue current supplements.    The patient was encouraged to continue current medications, exercise, lose weight and call with any new complaints or concerns. Oral and written instructions provided; patient verbalized understanding.    Follow-up Disposition:  Return in about 8 months (around 03/07/2015). (when he returns from [place])  HDL labs prior with HgA1C      Cheri Fowler, WHNP, ANP

## 2014-12-02 ENCOUNTER — Encounter

## 2014-12-02 MED ORDER — EZETIMIBE 10 MG TAB
10 mg | ORAL_TABLET | Freq: Every day | ORAL | Status: DC
Start: 2014-12-02 — End: 2015-03-15

## 2014-12-02 NOTE — Telephone Encounter (Signed)
90 day supply     Pharmacy was verified

## 2014-12-02 NOTE — Telephone Encounter (Signed)
Refax[facility] script to [facility]

## 2014-12-22 NOTE — Telephone Encounter (Signed)
The patient can be reached at [phone]. He would like to see Milbert CoulterKapadia or [facility] for a 05/23 appointment. Please call him as soon as you're able. Thanks!

## 2015-02-21 NOTE — Telephone Encounter (Signed)
Pt called to get lab paperwork and will come pick it up. He can be reached at [phone]. Thanks!

## 2015-02-22 ENCOUNTER — Telehealth

## 2015-02-22 NOTE — Telephone Encounter (Signed)
Lab slip printed

## 2015-02-22 NOTE — Telephone Encounter (Signed)
The patient would like to pick up new lab order tomorrow morning at [facility]09AM 05/11. He can be reached at [phone]. Thanks!

## 2015-02-23 NOTE — Addendum Note (Signed)
Addended by: Carrington ClampHester, Devron Cohick D on: 02/23/2015 09:12 AM      Modules accepted: Orders

## 2015-02-23 NOTE — Telephone Encounter (Signed)
See previous encounter.

## 2015-02-26 LAB — SMALL DENSE LDL (HDL ONLY)
% sdLDL (calculated): 23 (ref <26)
Small Dense LDL: 15 mg/dL (ref <21)

## 2015-02-26 LAB — LIPID PANEL (HDL ONLY)
Cholesterol, total: 123 mg/dL (ref <200)
HDL Cholesterol: 49 mg/dL (ref >39)
LDL, calculated: 66 mg/dL (ref <100)
Non-HDL Cholesterol: 74 mg/dL (ref <130)
Triglyceride: 53 mg/dL (ref <150)

## 2015-02-26 LAB — FATTY ACIDS, FREE (HDL ONLY): FFA free fatty acids: 0.39 mmol/L (ref <0.60)

## 2015-02-26 LAB — HOMOCYSTEINE (HDL ONLY): Homocysteine, plasma: 7 umol/L (ref <11)

## 2015-02-26 LAB — HDL 2 SUBCLASS (HDL ONLY): HDL 2 subclass: 12 mg/dL (ref >11)

## 2015-02-26 LAB — LIPOPROTEIN ASSOC PHOSPHOLIPID (HDL ONLY): Lp-PLA2: 85 ng/mL (ref <291)

## 2015-02-26 LAB — URINE CREATININE (HDL ONLY): Creatinine w AspirinWorks: 210 mg/dL (ref 20–400)

## 2015-02-26 LAB — APOLIPOPROTEIN A-1 (HDL ONLY)
Apo B: Apo A1 Ratio: 0.39 — ABNORMAL LOW (ref 0.61–0.80)
Apolipoprotein A-1: 139 mg/dL (ref >131)

## 2015-02-27 LAB — METABOLIC PANEL, COMPREHENSIVE (HDL ONLY)
% ALBUMIN: 62 % (ref 54–71)
ALB/GLOBRATIO: 1.6 (ref 1.15–2.50)
ALT (SGPT): 18 U/L (ref <42)
AST (SGOT): 19 U/L (ref <41)
Albumin: 4.3 g/dL (ref 3.7–5.1)
Alk. phosphatase: 67 U/L (ref 35–117)
Anion gap: 11 (ref 6–18)
BUN/Creatinine ratio: 26 (ref 10–27)
BUN: 22 mg/dL — ABNORMAL HIGH (ref 6–20)
Bilirubin, total: 1.2 mg/dL (ref <1.3)
CO2: 22 mmol/L (ref 19–31)
Calcium: 9.2 mg/dL (ref 8.8–10.5)
Chloride: 109 mmol/L (ref 98–110)
Creatinine: 0.9 mg/dL (ref 0.7–1.2)
GLOBCALC: 2.7 g/dL (ref 1.9–3.5)
Glucose: 98 mg/dL (ref 70–99)
Potassium: 4.5 mmol/L (ref 3.5–5.3)
Protein, total: 6.9 g/dL (ref 6.1–8.0)
Sodium: 142 mmol/L (ref 133–145)

## 2015-02-27 LAB — MYELOPEROXIDASE, AB (HDL ONLY): MPOAU: 243 pmol/L (ref <256)

## 2015-02-27 LAB — VITAMIN D, 25 HYDROXY (HDL ONLY): VITAMIN D, 25-HYDROXY: 56 ng/mL (ref 30–100)

## 2015-02-27 LAB — INSULIN (HDL ONLY)
Insulin: 11 uU/mL — ABNORMAL HIGH (ref 3–9)
LIHS-AU-H: 0 (ref <1)

## 2015-02-27 LAB — CRP, HIGH SENSITIVITY: CRP, High sensitivity: <0.3 mg/L (ref <1.0)

## 2015-02-27 LAB — LDL-P HDL (HDL ONLY)
HDL HDL-P: 36.3 umol/L — ABNORMAL LOW (ref >38.0)
HDL LDL-P: 1027 nmol/L — ABNORMAL HIGH (ref <1020)
HDL SLDL-P: 495 nmol/L (ref <501)

## 2015-02-27 LAB — APOLIPOPROTEIN B (HDL ONLY): Apolipoprotein B: 54 mg/dL (ref <60)

## 2015-02-27 LAB — STEROLS (HDL ONLY)
Campesterol: 2.02 ug/mL — ABNORMAL LOW (ref 2.11–4.43)
Cholestanol: 2.22 ug/mL (ref 2.02–3.47)
Desmosterol: 0.7 ug/mL (ref <1.28)
Sitosterol: 1.89 ug/mL (ref 1.43–3.17)

## 2015-02-27 LAB — NT-PROBNP (HDL ONLY): Pro BNP,NT: 99 pg/mL (ref <125)

## 2015-02-28 LAB — BLOOD FATTY ACIDS PROFILE (HDL ONLY)
Alpha linoleic acid n3: 0.101 % (ref 0.100–0.400)
Cis-mono-unsaturated fatty acid total: 15.5 (ref 11.5–20.5)
Docosapentaenoic acid n3: 3.688 % (ref 0.600–4.100)
Docosapentaenoic acid n6: 0.435 % (ref 0.100–1.300)
Omega-3 Fatty Acid total: 12.6 (ref 0.1–14.1)
Omega-3 index: 8.8 (ref 0.1–10.4)
Omega-6 Fatty Acid total: 29.3 (ref 28.6–44.5)
Saturated fatty acid total: 41.8 (ref 36.6–42.0)
Trans fatty acid total: 0.9 (ref 0.1–1.8)
Translinoleic acid: <0.1 % (ref 0.1–0.5)
Transoleic acid: 0.607 % (ref 0.100–1.300)

## 2015-02-28 LAB — ASPIRINWORKS(11-DEHYDRO-THROMBOXANE B2)(HDL ONLY): 11-Dehydro-Thromboxane B2: 405 pg/mL (ref <1501)

## 2015-02-28 LAB — LP(A)-P (HDL ONLY)
Lp(a)-P: 174 nmol/L — ABNORMAL HIGH (ref <75)
TRIG ALIAS-LP(A)-P: 52.62

## 2015-03-09 ENCOUNTER — Encounter

## 2015-03-15 ENCOUNTER — Ambulatory Visit: Admit: 2015-03-15 | Discharge: 2015-03-15 | Payer: MEDICARE | Attending: Specialist

## 2015-03-15 DIAGNOSIS — I2510 Atherosclerotic heart disease of native coronary artery without angina pectoris: Secondary | ICD-10-CM

## 2015-03-15 MED ORDER — ATORVASTATIN 20 MG TAB
20 mg | ORAL_TABLET | Freq: Every day | ORAL | Status: DC
Start: 2015-03-15 — End: 2015-07-05

## 2015-03-15 MED ORDER — METFORMIN SR 500 MG 24 HR TABLET
500 mg | ORAL_TABLET | Freq: Every day | ORAL | Status: DC
Start: 2015-03-15 — End: 2015-07-05

## 2015-03-15 MED ORDER — EZETIMIBE 10 MG TAB
10 mg | ORAL_TABLET | Freq: Every day | ORAL | Status: DC
Start: 2015-03-15 — End: 2015-07-05

## 2015-03-15 NOTE — Progress Notes (Signed)
HISTORY OF PRESENT ILLNESS  Edward Villarreal is a 69 y.o. male. Last seen 10 months ago.    Problem List  Date Reviewed: 08/02/2014          Codes Class Noted    Insulin resistance ICD-10-CM: E88.81  ICD-9-CM: 277.7  08/02/2014        Dyslipidemia ICD-10-CM: E78.5  ICD-9-CM: 272.4  05/07/2014        Agatston CAC score, <100 ICD-10-CM: R93.1  ICD-9-CM: 793.2  02/05/2013        Sinus bradycardia ICD-10-CM: R00.1  ICD-9-CM: 427.89  03/07/2012        CAD (coronary artery disease) ICD-10-CM: I25.10  ICD-9-CM: 414.00  02/28/2011            Cardiac testing  CAC 09/27/2008 - 42.7  Exercise cardiolite 02/2011 - 12 min, normal perfusion, EF 59%     HPI  He is doing well with no cardiac complaints. He remains active with golf but no structured exercise, no exertional complaints. Patient denies any exertional chest pain, dyspnea, palpitations, syncope, orthopnea, edema or paroxysmal nocturnal dyspnea.    Cardiac risk factors   HTN no  DM no  Smoking no  Family hx of CAD yes - father with CABG      Past Medical History   Diagnosis Date   ??? DJD (degenerative joint disease)      L4-L5, tx with PT and nerve blocks   ??? GERD (gastroesophageal reflux disease)    ??? Hyperlipidemia    ??? Sinus bradycardia    ??? Mixed hyperlipidemia    ??? IGT (impaired glucose tolerance)    ??? CAD (coronary artery disease)      CAC 42 Dec 2009       Current Outpatient Prescriptions   Medication Sig Dispense Refill   ??? ezetimibe (ZETIA) 10 mg tablet Take 1 Tab by mouth daily. May dispense Brand or generic prescription. 90 Tab 3   ??? metFORMIN ER (GLUCOPHAGE XR) 500 mg tablet Take 3 Tabs by mouth daily (with dinner). 270 Tab 3   ??? atorvastatin (LIPITOR) 20 mg tablet Take 1 Tab by mouth daily. 90 Tab 3   ??? DOCOSAHEXANOIC ACID/EPA (FISH OIL PO) Take  by mouth three (3) times daily. Mornings     ??? aspirin delayed-release 81 mg tablet Take  by mouth daily.     ??? propranolol (INDERAL) 80 mg tablet Take 40 mg by mouth as needed.      ??? ranitidine (ZANTAC) 75 mg tablet Take 150 mg by mouth as needed.       Allergies   Allergen Reactions   ??? Other Plant, Educational psychologist, Environmental Anaphylaxis     Fire ants       Married. Retired - Garment/textile technologist. Nonsmoker.     Review of Systems  Constitutional: Negative for fever, chills, malaise/fatigue and diaphoresis.   Respiratory: Negative for cough, hemoptysis, sputum production, shortness of breath and wheezing.    Cardiovascular: Negative for chest pain, palpitations, orthopnea, claudication, leg swelling and PND.   Gastrointestinal: Negative for heartburn, nausea, vomiting, blood in stool and melena.   Genitourinary: Negative for dysuria and flank pain.   Musculoskeletal: Negative for joint pain and back pain.   Skin: Negative for rash.   Neurological: Negative for focal weakness, seizures, loss of consciousness, weakness and headaches.   Endo/Heme/Allergies: Does not bruise/bleed easily.   Psychiatric/Behavioral: Negative for memory loss. The patient does not have insomnia.        BP  128/76 mmHg   Pulse 52   Resp 16   Ht 5\' 2"  (1.575 m)   Wt 170 lb 12.8 oz (77.474 kg)   BMI 31.23 kg/m2  Wt Readings from Last 3 Encounters:   03/15/15 170 lb 12.8 oz (77.474 kg)   07/07/14 170 lb 9.6 oz (77.384 kg)   05/07/14 171 lb (77.565 kg)     Physical Exam   General - well developed well nourished  Neck - JVP normal, thyroid normal  Cardiac - normal S1,S2, no rubs or gallops. No clicks. Grade 2 early peaking systolic ejection murmur  Vascular - carotids without bruits, radials, femorals and pedal pulses equal bilateral  Lungs - clear to auscultation bilaterals, no rales, wheezing or rhonchi  Abd - soft nontender, no HSM, no abd bruits  Extremities - no edema  Skin - no rash  Neuro - nonfocal  Psych - normal mood and affect    Cardiographics  EKG 05/07/14 - sinus 58, normal  EKG 03/15/15 - SB 52, normal    HDL lab results from 05/20/12:   Lab test  High risk  Intermediate risk  Optimal    TC    128    LDL    79     HDL    41    TG    88    Non-HDL    87    Apo-B   62     LDL-p    875    %sdLDL    24    HDL2  8      Lp(a) chol  154      Myeloperoxidase    267    hs-CRP    <0.3    INT-proBNP    62    Aspirin Works    1085    ApoE genotype    3/3    CYP2C19    *1/*1    Insulin    5    Glucose    92    Vitamin D    55    campesterol   4.04 optimal     Campesterol ratio  305 hyper      sitosterol   3.11 optimal     Sitosterol ratio  227 hyper      cholestanol   3.12 optimal     Cholestanol ratio  242 hyper      desmosterol    0.40 hypo    Desmosterol ratio   31     HS Omega 3 index   6.0       HDL results from 01/23/13   Lab test  High risk  Intermediate risk  Optimal    TC    139    LDL    79    HDL    47    TG   154     Non-HDL    92    Apo-B   70     LDL-p    899    %sdLDL   29     HDL2    14    Lp(a) chol       Myeloperoxidase    217    hs-CRP    <0.3    INT-proBNP    47    Aspirin Works    500    ApoE genotype       CYP2C19       Insulin  14      Glucose  96    Vitamin D    46    campesterol  5.22 hyper      Campesterol ratio       sitosterol  3.62 hyper      Sitosterol ratio       cholestanol    3.15    Cholestanol ratio       desmosterol    0.57    Desmosterol ratio       HS Omega 3 index   4.9         ASSESSMENT and PLAN  Encounter Diagnoses   Name Primary?   ??? Coronary artery disease involving native coronary artery of native heart without angina pectoris Yes   ??? Insulin resistance    ??? Dyslipidemia    ??? Sinus bradycardia    ??? Agatston CAC score, <100      [name] has subclinical CAD with low calcium score. Reviewed sxs that would raise concern.    Advanced lipid testing demonstrates optimized lipoproteins, inflammatory markers. Insulin is borderline elevated with normal fasting glucose on metformin. Overall, his numbers look good. Continue current treatment. Recheck in 6 months.    The patient was encouraged to continue current medications, exercise, lose weight and call with any new complaints or concerns.     Follow-up Disposition:  Return in about 6 months (around 09/14/2015).  THD labs prior    Written by Darcel Smalling, as dictated by Doreene Adas, MD.  Rhona Leavens, MD

## 2015-06-22 LAB — APOLIPOPROTEIN A-1 (HDL ONLY)
Apo B: Apo A1 Ratio: 0.39 — ABNORMAL LOW (ref 0.61–0.80)
Apolipoprotein A-1: 152 mg/dL (ref >131)

## 2015-06-22 LAB — HDL 2 SUBCLASS (HDL ONLY): HDL 2 subclass: 8 mg/dL — ABNORMAL LOW (ref >11)

## 2015-06-22 LAB — LIPOPROTEIN ASSOC PHOSPHOLIPID (HDL ONLY): Lp-PLA2: 80 ng/mL (ref <291)

## 2015-06-22 LAB — FATTY ACIDS, FREE (HDL ONLY): FFA free fatty acids: 0.56 mmol/L (ref <0.60)

## 2015-06-22 LAB — NT-PROBNP (HDL ONLY): Pro BNP,NT: 65 pg/mL (ref <125)

## 2015-06-23 LAB — METABOLIC PANEL, COMPREHENSIVE (HDL ONLY)
% ALBUMIN: 62 % (ref 54–71)
ALB/GLOBRATIO: 1.66 (ref 1.15–2.50)
ALT (SGPT): 14 U/L (ref <42)
AST (SGOT): 19 U/L (ref 5–40)
Albumin: 4.2 g/dL (ref 3.7–5.1)
Alk. phosphatase: 69 U/L (ref 35–117)
Anion gap: 13 (ref 6–18)
BUN/Creatinine ratio: 23 (ref 10–27)
BUN: 21 mg/dL — ABNORMAL HIGH (ref 6–20)
Bilirubin, total: 0.9 mg/dL (ref <1.3)
CO2: 23 mmol/L (ref 19–31)
Calcium: 9 mg/dL (ref 8.8–10.5)
Chloride: 105 mmol/L (ref 98–110)
Creatinine: 0.9 mg/dL (ref 0.7–1.2)
GLOBCALC: 2.5 g/dL (ref 1.9–3.5)
Glucose: 91 mg/dL (ref 70–99)
Potassium: 4.3 mmol/L (ref 3.5–5.3)
Protein, total: 6.7 g/dL (ref 6.1–8.0)
Sodium: 141 mmol/L (ref 133–145)

## 2015-06-23 LAB — STEROLS (HDL ONLY)
Campesterol: 1.97 ug/mL — ABNORMAL LOW (ref 2.11–4.43)
Cholestanol: 2.3 ug/mL (ref 2.02–3.47)
Desmosterol: 0.66 ug/mL (ref <1.28)
Sitosterol: 1.89 ug/mL (ref 1.43–3.17)

## 2015-06-23 LAB — LDL-P HDL (HDL ONLY)
HDL HDL-P: 37.4 umol/L — ABNORMAL LOW (ref >38.0)
HDL LDL-P: 784 nmol/L (ref <1020)
HDL SLDL-P: 439 nmol/L (ref <501)

## 2015-06-23 LAB — LIPID PANEL
Cholesterol, total: 131 mg/dL (ref <200)
HDL Cholesterol: 52 mg/dL (ref >40)
LDL, calculated: 72 mg/dL (ref <100)
N-HDL-C: 79 mg/dL (ref <130)
Triglyceride: 90 mg/dL (ref <150)

## 2015-06-23 LAB — 25-HYDROXY-VITAMIN D-TX: VIT D-TX: 41 ng/mL (ref 30–100)

## 2015-06-23 LAB — APOLIPOPROTEIN B (HDL ONLY): Apolipoprotein B: 59 mg/dL (ref <60)

## 2015-06-23 LAB — SMALL DENSE LOW-DENSITY LIPOPROTEIN CHOLESTEROL-TX: SDLDL: 19.7 mg/dL (ref <20.0)

## 2015-06-23 LAB — HIGH-SENSITIVITY C-REACTIVE PROTEIN-TX: HSCRP-TX: <0.2 mg/L (ref <5.0)

## 2015-06-23 LAB — INSULIN (HDL ONLY): Insulin: 12 uU/mL — ABNORMAL HIGH (ref 3–9)

## 2015-06-24 LAB — BLOOD FATTY ACIDS PROFILE (HDL ONLY)
Alpha linoleic acid n3: 0.128 % (ref 0.100–0.400)
Cis-mono-unsaturated fatty acid total: 15.3 (ref 11.5–20.5)
Docosapentaenoic acid n3: 2.95 % (ref 0.600–4.100)
Docosapentaenoic acid n6: 0.46 % (ref 0.100–1.300)
Omega-3 Fatty Acid total: 12.1 (ref 0.1–14.1)
Omega-3 index: 9 (ref 0.1–10.4)
Omega-6 Fatty Acid total: 29.4 (ref 28.6–44.5)
Saturated fatty acid total: 42.3 — ABNORMAL HIGH (ref 36.6–42.0)
Trans fatty acid total: 0.9 (ref 0.1–1.8)
Translinoleic acid: 0.137 % (ref 0.100–0.500)
Transoleic acid: 0.657 % (ref 0.100–1.300)

## 2015-07-05 ENCOUNTER — Ambulatory Visit: Admit: 2015-07-05 | Discharge: 2015-07-05 | Payer: MEDICARE | Attending: "Women's Health Care

## 2015-07-05 ENCOUNTER — Encounter: Attending: "Women's Health Care

## 2015-07-05 DIAGNOSIS — I2510 Atherosclerotic heart disease of native coronary artery without angina pectoris: Secondary | ICD-10-CM

## 2015-07-05 MED ORDER — PROPRANOLOL 80 MG TAB
80 mg | ORAL_TABLET | ORAL | 5 refills | Status: DC | PRN
Start: 2015-07-05 — End: 2015-07-06

## 2015-07-05 MED ORDER — ATORVASTATIN 20 MG TAB
20 mg | ORAL_TABLET | Freq: Every day | ORAL | 3 refills | Status: DC
Start: 2015-07-05 — End: 2016-03-02

## 2015-07-05 MED ORDER — METFORMIN SR 500 MG 24 HR TABLET
500 mg | ORAL_TABLET | Freq: Every day | ORAL | 3 refills | Status: DC
Start: 2015-07-05 — End: 2016-03-02

## 2015-07-05 MED ORDER — ZETIA 10 MG TABLET
10 mg | ORAL_TABLET | Freq: Every day | ORAL | 3 refills | Status: DC
Start: 2015-07-05 — End: 2015-12-22

## 2015-07-05 MED ORDER — ZETIA 10 MG TABLET
10 mg | ORAL_TABLET | Freq: Every day | ORAL | 3 refills | Status: DC
Start: 2015-07-05 — End: 2015-07-05

## 2015-07-05 NOTE — Progress Notes (Signed)
Labs drawn 06/21/15       High Risk Intermediate Risk Optimal   Total Cholesterol   131   LDL   72   HDL   52   TG   90   Non-HDL   79   Apo B   59   LDL-P   784   Small LDL-P   439   sdLDL-C   20   Apo A-I   152   HDL-P  37.4    HDL2-C 8     Apo B: Apo A-I ratio   0.39   Lp(a)-P      Lp Cholesterol      Myeloperoxidase      Lp-PLA   80   Hs-CRP   <0.2   Fibrinogen      proBNP   65   AspirinWorks      Apolipoprotein E      [id number]*4*0*      CYP2C19*17*      Factor V Leiden      Prothrombin Mutation      Insulin 12     Free Fatty Acid   0.56   Glucose   91   HbA1c      Estimated Average Glucose      25-hydroxy-Vitamin D   41   TSH      Homocysteine      Creatinine, serum   0.9   Campesterol   1.97   Campesterol Ratio  1.89    Sitosterol      Sitosterol Ratio      Cholestanol  2.30    Cholestanol Ratio      Desmosterol  0.66    Omega 3

## 2015-07-05 NOTE — Patient Instructions (Signed)
I still think it would be helpful for you to move your body.  A body in motion stays in motion; a body at rest stays at rest.

## 2015-07-05 NOTE — Progress Notes (Signed)
Sinus brady at 54 bpm, NSST changes (same as previous)

## 2015-07-05 NOTE — Progress Notes (Signed)
Labs drawn 06/21/15        High Risk Intermediate Risk Optimal   Total Cholesterol   131   LDL   72   HDL   52   TG   90   Non-HDL   79   Apo B   59   LDL-P   784   Small LDL-P   439   sdLDL-C   20   Apo A-I   152   HDL-P  37.4    HDL2-C 8     Apo B: Apo A-I ratio   0.39   Lp(a)-P      Lp Cholesterol      Myeloperoxidase      Lp-PLA      Hs-CRP      Fibrinogen      proBNP      AspirinWorks      Apolipoprotein E      [id number]*9*8*      CYP2C19*17*      Factor V Leiden      Prothrombin Mutation      Insulin      Free Fatty Acid      Glucose      HbA1c      Estimated Average Glucose      25-hydroxy-Vitamin D      TSH      Homocysteine      Creatinine, serum      Campesterol      Campesterol Ratio      Sitosterol      Sitosterol Ratio      Cholestanol      Cholestanol Ratio      Desmosterol      Omega 3

## 2015-07-05 NOTE — Progress Notes (Signed)
HISTORY OF PRESENT ILLNESS  Edward Villarreal is a 69 y.o. male. Last seen 5/16 by Dr. Milbert Villarreal.  Here today to follow up on CAD, dyslipidemia, insulin resistance.    Problem List  Date Reviewed: 07/10/2015          Codes Class Noted    Insulin resistance ICD-10-CM: E88.81  ICD-9-CM: 277.7  07/07/2014        Dyslipidemia ICD-10-CM: E78.5  ICD-9-CM: 272.4  05/07/2014        Agatston CAC score, <100 ICD-10-CM: R93.1  ICD-9-CM: 793.2  02/05/2013        Sinus bradycardia ICD-10-CM: R00.1  ICD-9-CM: 427.89  03/07/2012        CAD (coronary artery disease) ICD-10-CM: I25.10  ICD-9-CM: 414.00  02/28/2011            Cardiac testing  CAC 09/27/2008 - 42.7  Exercise cardiolite 02/2011 - 12 min, normal perfusion, EF 59%     HPI  He is doing well with no cardiac complaints. He remains active with golf but no structured exercise, no exertional complaints. Patient denies any exertional chest pain, dyspnea, palpitations, syncope, orthopnea, edema or paroxysmal nocturnal dyspnea. He has noticed that his stamina has decreased. No regular exercise.    Dizziness with head movement; has been going on for the past 11 days.  Will be seeing Dr. Camille Villarreal tomorrow for this; it is easing.    Cardiac risk factors   HTN no  DM no  Smoking no  Family hx of CAD yes - father with CABG      Past Medical History   Diagnosis Date   ??? CAD (coronary artery disease)      CAC 42 Dec 2009   ??? DJD (degenerative joint disease)      L4-L5, tx with PT and nerve blocks   ??? GERD (gastroesophageal reflux disease)    ??? Hyperlipidemia    ??? IGT (impaired glucose tolerance)    ??? Mixed hyperlipidemia    ??? Sinus bradycardia        Current Outpatient Prescriptions   Medication Sig Dispense Refill   ??? cholecalciferol (VITAMIN D3) 1,000 unit cap Take  by mouth daily.     ??? ezetimibe (ZETIA) 10 mg tablet Take 1 Tab by mouth daily. May dispense Brand or generic prescription. 90 Tab 3   ??? metFORMIN ER (GLUCOPHAGE XR) 500 mg tablet Take 3 Tabs by mouth daily (with dinner). 270 Tab 3    ??? atorvastatin (LIPITOR) 20 mg tablet Take 1 Tab by mouth daily. 90 Tab 3   ??? DOCOSAHEXANOIC ACID/EPA (FISH OIL PO) Take  by mouth three (3) times daily. Mornings     ??? aspirin delayed-release 81 mg tablet Take  by mouth daily.     ??? propranolol (INDERAL) 80 mg tablet Take 40 mg by mouth as needed.     ??? ranitidine (ZANTAC) 75 mg tablet Take 150 mg by mouth as needed.       Allergies   Allergen Reactions   ??? Other Plant, Educational psychologist, Environmental Anaphylaxis     Fire ants       Married. Retired - Garment/textile technologist. Nonsmoker.     Review of Systems  Constitutional: Negative for fever, chills, malaise/fatigue and diaphoresis.   Respiratory: Negative for cough, hemoptysis, sputum production, shortness of breath and wheezing.    Cardiovascular: Negative for chest pain, palpitations, orthopnea, claudication, leg swelling and PND.   Gastrointestinal: Negative for heartburn, nausea, vomiting, blood in stool and melena.  Genitourinary: Negative for dysuria and flank pain.   Musculoskeletal: Negative for joint pain and back pain.   Skin: Negative for rash.   Neurological: Negative for focal weakness, seizures, loss of consciousness, weakness and headaches.   Endo/Heme/Allergies: Does not bruise/bleed easily.   Psychiatric/Behavioral: Negative for memory loss. The patient does not have insomnia.        Visit Vitals   ??? BP 120/80 (BP 1 Location: Left arm, BP Patient Position: At rest)   ??? Pulse (!) 58   ??? Resp 19   ??? Wt 172 lb (78 kg)   ??? SpO2 99%   ??? BMI 31.46 kg/m2     Wt Readings from Last 3 Encounters:   07/05/15 172 lb (78 kg)   03/15/15 170 lb 12.8 oz (77.5 kg)   07/07/14 170 lb 9.6 oz (77.4 kg)     Physical Exam   General - well developed well nourished  Neck - JVP normal, thyroid normal  Cardiac - normal S1,S2, no rubs or gallops. No clicks. Grade 2 early peaking systolic ejection murmur  Vascular - carotids without bruits, radials, femorals and pedal pulses equal bilateral   Lungs - clear to auscultation bilaterals, no rales, wheezing or rhonchi  Abd - soft nontender, no HSM, no abd bruits  Extremities - no edema  Skin - no rash  Neuro - nonfocal  Psych - normal mood and affect    Cardiographics  EKG 05/07/14 - sinus 58, normal  EKG 03/15/15 - SB 52, normal    Labs optimized except insulin of 12.    ASSESSMENT and PLAN  Encounter Diagnoses   Name Primary?   ??? Coronary artery disease involving native coronary artery of native heart without angina pectoris    ??? Dyslipidemia    ??? Insulin resistance      Edward Villarreal has subclinical CAD with low calcium score. Again reviewed sxs that would raise concern.    Advanced lipid testing demonstrates optimized lipoproteins, inflammatory markers. Insulin is borderline elevated with normal fasting glucose on metformin. Overall, his numbers look good. Continue current treatment. Encouraged him again to adopt a regular exercise program, but he has shown little interest in this.     Blood pressure is well-controlled; rate remains slightly bradycardic on propranolol. Asymptomatic.    The patient was encouraged to continue current medications, exercise, lose weight and call with any new complaints or concerns.    Follow-up Disposition:  Return in about 8 months (around 03/03/2016). with Dr. Natale Villarreal prior     Edward Villarreal, ANP

## 2015-07-05 NOTE — Progress Notes (Signed)
Extended / Orthostatic Vitals:  Patient Position 2: Supine  BP 2: 140/76  Pulse 2: 57  Patient Position 3: Sitting  BP 3: 130/70  Pulse 3: 61  Patient Position 4: Standing  BP 4: 130/74  Pulse 4: 61

## 2015-07-06 MED ORDER — PROPRANOLOL 80 MG TAB
80 mg | ORAL_TABLET | ORAL | 5 refills | Status: DC | PRN
Start: 2015-07-06 — End: 2018-04-29

## 2015-07-11 ENCOUNTER — Encounter

## 2015-07-15 ENCOUNTER — Ambulatory Visit: Payer: MEDICARE

## 2015-07-20 ENCOUNTER — Inpatient Hospital Stay: Admit: 2015-07-20 | Payer: MEDICARE | Attending: Orthopaedic Surgery

## 2015-07-20 DIAGNOSIS — M7582 Other shoulder lesions, left shoulder: Secondary | ICD-10-CM

## 2015-07-21 NOTE — Progress Notes (Signed)
Faxed over Clarification to [employer]   propranolol (INDERAL) 80 mg tablet [[id number]]   Order Details   Dose: 40 mg Route: Oral Frequency: AS NEEDED   Dispense Quantity:  30 Tab Refills:  5 Fills Remaining:  5    ??      Sig: Take 0.5 Tabs by mouth as needed.   ??      Written Date:  07/06/15 Expiration Date:  --     Start Date:  07/06/15 End Date:  --     ??      Ordering Provider:  Cheri Fowler, NP DEA #:  [id number] NPI:  [id number]    Authorizing Provider:  Cheri Fowler, NP DEA #:  [id number] NPI:  [id number]       Original Order:  propranolol (INDERAL) 80 mg tablet [[id number]]      Pharmacy:  [facility] - [place], [place] - [address] DEA #:  --     Pharmacy Comments:  --

## 2015-12-22 ENCOUNTER — Encounter

## 2015-12-22 MED ORDER — ZETIA 10 MG TABLET
10 mg | ORAL_TABLET | Freq: Every day | ORAL | 3 refills | Status: DC
Start: 2015-12-22 — End: 2016-03-02

## 2015-12-22 MED ORDER — ZETIA 10 MG TABLET
10 mg | ORAL_TABLET | Freq: Every day | ORAL | 3 refills | Status: DC
Start: 2015-12-22 — End: 2015-12-22

## 2015-12-22 NOTE — Telephone Encounter (Signed)
Per MD, script printed and faxed to [facility] at [phone].

## 2015-12-22 NOTE — Telephone Encounter (Signed)
Please fill for 3 refills

## 2016-02-24 LAB — METABOLIC PANEL, COMPREHENSIVE (HDL ONLY)
% ALBUMIN: 62 % (ref 54–71)
ALB/GLOBRATIO: 1.61 (ref 1.15–2.50)
ALT (SGPT): 19 U/L (ref <42)
AST (SGOT): 33 U/L (ref 5–40)
Albumin: 4.2 g/dL (ref 3.7–5.1)
Alk. phosphatase: 68 U/L (ref 35–117)
Anion gap: 11 (ref 6–18)
BUN/Creatinine ratio: 38 — ABNORMAL HIGH (ref 10–27)
BUN: 32 mg/dL — ABNORMAL HIGH (ref 6–20)
Bilirubin, total: 0.8 mg/dL (ref <1.3)
CO2: 23 mmol/L (ref 19–31)
Calcium: 8.9 mg/dL (ref 8.8–10.5)
Chloride: 106 mmol/L (ref 98–110)
Creatinine: 0.8 mg/dL (ref 0.7–1.2)
GLOBCALC: 2.6 g/dL (ref 1.9–3.5)
Protein, total: 6.8 g/dL (ref 6.1–8.0)
Sodium: 140 mmol/L (ref 133–145)

## 2016-02-24 LAB — INSULIN (HDL ONLY): Insulin: 7 uU/mL (ref 3–9)

## 2016-02-24 LAB — BLOOD FATTY ACIDS PROFILE (HDL ONLY)
Alpha linoleic acid n3: <0.1 % (ref 0.1–0.4)
Cis-mono-unsaturated fatty acid total: 15.1 (ref 11.5–20.5)
Docosapentaenoic acid n3: 2.804 % (ref 0.600–4.100)
Docosapentaenoic acid n6: 0.578 % (ref 0.100–1.300)
Omega-3 Fatty Acid total: 10.2 (ref 0.1–14.1)
Omega-3 index: 7.3 (ref 0.1–10.4)
Omega-6 Fatty Acid total: 32.3 (ref 28.6–44.5)
Saturated fatty acid total: 41.6 (ref 36.6–42.0)
Trans fatty acid total: 0.8 (ref 0.1–1.8)
Translinoleic acid: <0.1 % (ref 0.1–0.5)
Transoleic acid: 0.579 % (ref 0.100–1.300)

## 2016-02-24 LAB — LIPID PANEL (HDL ONLY)
Cholesterol, total: 110 mg/dL (ref <200)
HDL Cholesterol: 46 mg/dL (ref >39)
LDL, calculated: 60 mg/dL (ref <100)
Non-HDL Cholesterol: 64 mg/dL (ref <130)
Triglyceride: 75 mg/dL (ref <150)

## 2016-02-24 LAB — APOLIPOPROTEIN A-1 (HDL ONLY)
Apo B: Apo A1 Ratio: 0.49 — ABNORMAL LOW (ref 0.61–0.80)
Apolipoprotein A-1: 133 mg/dL (ref >131)

## 2016-02-24 LAB — STEROLS (HDL ONLY)
Campesterol Ratio: 139 (ref 115–240)
Campesterol: 1.59 ug/mL — ABNORMAL LOW (ref 2.11–4.43)
Cholestanol Ratio: 181 (ref 117–194)
Cholestanol: 2 ug/mL — ABNORMAL LOW (ref 2.02–3.47)
Desmosterol Ratio: 66 — ABNORMAL HIGH (ref 31–64)
Desmosterol: 0.72 ug/mL (ref <1.28)
Sitosterol Ratio: 133 (ref 76–168)
Sitosterol: 1.57 ug/mL (ref 1.43–3.17)

## 2016-02-24 LAB — LIPOPROTEIN ASSOC PHOSPHOLIPID (HDL ONLY): Lp-PLA2: 98 ng/mL (ref <291)

## 2016-02-24 LAB — HDL 2 SUBCLASS (HDL ONLY): HDL 2 subclass: 10 mg/dL — ABNORMAL LOW (ref >11)

## 2016-02-24 LAB — SMALL DENSE LDL (HDL ONLY): Small Dense LDL: 14 mg/dL (ref <21)

## 2016-02-24 LAB — VITAMIN D, 25 HYDROXY (HDL ONLY): Vitamin D 25-Hydroxy: 62 ng/mL (ref 30–100)

## 2016-02-24 LAB — NT-PROBNP (HDL ONLY): Pro BNP,NT: 64 pg/mL (ref <125)

## 2016-02-24 LAB — CRP, HIGH SENSITIVITY: CRP, High sensitivity: 0.4 mg/L (ref <5.0)

## 2016-02-24 LAB — FATTY ACIDS, FREE (HDL ONLY): FFA free fatty acids: 0.58 mmol/L (ref <0.60)

## 2016-02-24 LAB — APOLIPOPROTEIN B (HDL ONLY): Apolipoprotein B: 65 mg/dL (ref <81)

## 2016-02-25 LAB — LDL-P HDL (HDL ONLY)
HDL HDL-P: 33.6 umol/L — ABNORMAL LOW (ref >38.0)
HDL LDL-P: 838 nmol/L (ref <1020)
HDL SLDL-P: 552 nmol/L — ABNORMAL HIGH (ref <501)

## 2016-03-01 ENCOUNTER — Encounter

## 2016-03-02 ENCOUNTER — Encounter: Attending: "Women's Health Care

## 2016-03-02 ENCOUNTER — Encounter: Attending: Nurse Practitioner

## 2016-03-02 ENCOUNTER — Ambulatory Visit: Admit: 2016-03-02 | Discharge: 2016-03-02 | Payer: MEDICARE | Attending: Specialist

## 2016-03-02 DIAGNOSIS — R931 Abnormal findings on diagnostic imaging of heart and coronary circulation: Secondary | ICD-10-CM

## 2016-03-02 MED ORDER — ZETIA 10 MG TABLET
10 mg | ORAL_TABLET | Freq: Every day | ORAL | 3 refills | Status: DC
Start: 2016-03-02 — End: 2017-04-25

## 2016-03-02 NOTE — Progress Notes (Signed)
HISTORY OF PRESENT ILLNESS  Edward Villarreal is a 70 y.o. male. Last seen 1 year ago.    Problem List  Date Reviewed: 07/05/2015          Codes Class Noted    Insulin resistance ICD-10-CM: E88.81  ICD-9-CM: 277.7  07/07/2014        Dyslipidemia ICD-10-CM: E78.5  ICD-9-CM: 272.4  05/07/2014        Agatston CAC score, <100 ICD-10-CM: R93.1  ICD-9-CM: 793.2  02/05/2013        Sinus bradycardia ICD-10-CM: R00.1  ICD-9-CM: 427.89  03/07/2012            Cardiac testing  CAC 09/27/2008 - 42.7  Exercise cardiolite 02/2011 - 12 min, normal perfusion, EF 59%     HPI    Edward Villarreal's has had no recurrent GERD symptoms since returning to [place] and exercising regularly. Denies any exertional symptoms. No claudication symptoms.     He takes Propanolol as needed for tremors, particularly when he plays golf.    Patient denies any exertional chest pain, dyspnea, palpitations, syncope, orthopnea, edema or paroxysmal nocturnal dyspnea.    Cardiac risk factors   HTN no  DM no  Smoking no  Family hx of CAD yes - father with CABG      Past Medical History:   Diagnosis Date   ??? CAD (coronary artery disease)     CAC 42 Dec 2009   ??? DJD (degenerative joint disease)     L4-L5, tx with PT and nerve blocks   ??? GERD (gastroesophageal reflux disease)    ??? Hyperlipidemia    ??? IGT (impaired glucose tolerance)    ??? Mixed hyperlipidemia    ??? Sinus bradycardia        Current Outpatient Prescriptions   Medication Sig Dispense Refill   ??? metFORMIN ER (GLUCOPHAGE XR) 500 mg tablet Take 3 Tabs by mouth daily (with dinner). 270 Tab 3   ??? atorvastatin (LIPITOR) 20 mg tablet Take 1 Tab by mouth daily. 90 Tab 3   ??? ZETIA 10 mg tablet Take 1 Tab by mouth daily. May dispense Brand or generic prescription. 90 Tab 3   ??? propranolol (INDERAL) 80 mg tablet Take 0.5 Tabs by mouth as needed. 30 Tab 5   ??? cholecalciferol (VITAMIN D3) 1,000 unit cap Take  by mouth daily.     ??? DOCOSAHEXANOIC ACID/EPA (FISH OIL PO) Take  by mouth three (3) times daily. Mornings      ??? aspirin delayed-release 81 mg tablet Take  by mouth daily.     ??? ranitidine (ZANTAC) 75 mg tablet Take 150 mg by mouth as needed.       Allergies   Allergen Reactions   ??? 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 Nonsmoker.     Review of Systems  Constitutional: Negative for fever, chills, malaise/fatigue and diaphoresis.   Respiratory: Negative for cough, hemoptysis, sputum production, shortness of breath and wheezing.    Cardiovascular: Negative for chest pain, palpitations, orthopnea, claudication, leg swelling and PND.   Gastrointestinal: Negative for heartburn, nausea, vomiting, blood in stool and melena.   Genitourinary: Negative for dysuria and flank pain.   Musculoskeletal: Negative for joint pain and back pain.   Skin: Negative for rash.   Neurological: Negative for focal weakness, seizures, loss of consciousness, weakness and headaches.   Endo/Heme/Allergies: Does not bruise/bleed easily.  Psychiatric/Behavioral: Negative for memory loss. The patient does not have insomnia.        Visit Vitals   ??? BP 130/76 (BP 1 Location: Left arm, BP Patient Position: Sitting)   ??? Pulse (!) 48   ??? Resp 16   ??? Ht 5\' 2"  (1.575 m)   ??? Wt 176 lb (79.8 kg)   ??? SpO2 96%   ??? BMI 32.19 kg/m2     Wt Readings from Last 3 Encounters:   03/02/16 176 lb (79.8 kg)   07/05/15 172 lb (78 kg)   03/15/15 170 lb 12.8 oz (77.5 kg)     Physical Exam   General - well developed well nourished  Neck - JVP normal, thyroid normal  Cardiac - normal S1,S2, no rubs or gallops. No clicks. Grade 2 early peaking systolic ejection murmur  Vascular - carotids without bruits, radials, femorals and pedal pulses equal bilateral  Lungs - clear to auscultation bilaterals, no rales, wheezing or rhonchi  Abd - soft nontender, no HSM, no abd bruits  Extremities - no edema  Skin - no rash  Neuro - nonfocal  Psych - normal mood and affect    Cardiographics  EKG 05/07/14 - sinus 58, normal   EKG 03/15/15 - SB 52, normal  Labs drawn 06/21/15     ?? High Risk Intermediate Risk Optimal   Total Cholesterol ?? ?? 131   LDL ?? ?? 72   HDL ?? ?? 52   TG ?? ?? 90   Non-HDL ?? ?? 79   Apo B ?? ?? 59   LDL-P ?? ?? 784   Small LDL-P ?? ?? 439   sdLDL-C ?? ?? 20   Apo A-I ?? ?? 152   HDL-P ?? 37.4 ??   HDL2-C 8 ?? ??   Apo B: Apo A-I ratio ?? ?? 0.39   Lp(a)-P ?? ?? ??   Lp Cholesterol ?? ?? ??   Myeloperoxidase ?? ?? ??   Lp-PLA ?? ?? ??   Hs-CRP ?? ?? ??   Fibrinogen ?? ?? ??   proBNP ?? ?? ??   AspirinWorks ?? ?? ??   Apolipoprotein E ?? ?? ??   ZOX0R60*4*5* ?? ?? ??   CYP2C19*17* ?? ?? ??   Factor V Leiden ?? ?? ??   Prothrombin Mutation ?? ?? ??   Insulin ?? ?? ??   Free Fatty Acid ?? ?? ??   Glucose ?? ?? ??   HbA1c ?? ?? ??   Estimated Average Glucose ?? ?? ??   25-hydroxy-Vitamin D ?? ?? ??   TSH ?? ?? ??   Homocysteine ?? ?? ??   Creatinine, serum ?? ?? ??   Campesterol ?? ?? ??   Campesterol Ratio ?? ?? ??   Sitosterol ?? ?? ??   Sitosterol Ratio ?? ?? ??   Cholestanol ?? ?? ??   Cholestanol Ratio ?? ?? ??   Desmosterol ?? ?? ??   Omega 3 ?? ?? ??       ASSESSMENT and PLAN  Encounter Diagnoses   Name Primary?   ??? Agatston CAC score, <100 Yes   ??? Insulin resistance    ??? Dyslipidemia    ??? Sinus bradycardia      Edward Villarreal has subclinical CAD with low calcium score by CT heart scan 2009. Stress nuclear study 2012 was normal. He has no sxs angina at a good functional capacity.     Advanced lipid testing demonstrates optimized lipids and  lipoproteins. Insulin markers have normalized. He remains adherent with Metformin, Atorvastatin and Zetia. Repeat numbers in 6 months. I encouraged him to work harder to reduce belly fat.     Follow-up Disposition:  Return in about 5 months (around 08/02/2016).     Written by Jonette Mate, ScribeKick, as dictated by Doreene Adas, MD.   Doreene Adas, MD

## 2016-03-04 MED ORDER — ATORVASTATIN 20 MG TAB
20 mg | ORAL_TABLET | Freq: Every day | ORAL | 3 refills | Status: DC
Start: 2016-03-04 — End: 2017-04-25

## 2016-03-04 MED ORDER — METFORMIN SR 500 MG 24 HR TABLET
500 mg | ORAL_TABLET | Freq: Every day | ORAL | 3 refills | Status: DC
Start: 2016-03-04 — End: 2016-07-25

## 2016-07-20 LAB — HDL 2 SUBCLASS (HDL ONLY): HDL 2 subclass: 14 mg/dL (ref >11)

## 2016-07-20 LAB — APOLIPOPROTEIN A-1 (HDL ONLY)
Apo B: Apo A1 Ratio: 0.51 — ABNORMAL LOW (ref 0.61–0.80)
Apolipoprotein A-1: 128 mg/dL — ABNORMAL LOW (ref >131)

## 2016-07-21 LAB — LP(A)-P (HDL ONLY): Lp(a)-P: <50 nmol/L (ref <75)

## 2016-07-21 LAB — METABOLIC PANEL, COMPREHENSIVE (HDL ONLY)
% ALBUMIN: 65 % (ref 54–71)
ALB/GLOBRATIO: 1.89 (ref 1.15–2.50)
ALT (SGPT): 37 U/L (ref <42)
AST (SGOT): 36 U/L (ref 5–40)
Albumin: 4.8 g/dL (ref 3.7–5.1)
Alk. phosphatase: 49 U/L (ref 35–117)
Anion gap: 15 (ref 6–18)
BUN/Creatinine ratio: 18 (ref 10–27)
BUN: 18 mg/dL (ref 6–20)
Bilirubin, total: 0.8 mg/dL (ref <1.3)
CO2: 23 mmol/L (ref 19–31)
Calcium: 9.7 mg/dL (ref 8.8–10.5)
Chloride: 104 mmol/L (ref 98–110)
Creatinine: 1 mg/dL (ref 0.7–1.2)
GLOBCALC: 2.5 g/dL (ref 1.9–3.5)
Glucose: 89 mg/dL (ref 70–99)
Potassium: 4.7 mmol/L (ref 3.5–5.3)
Protein, total: 7.3 g/dL (ref 6.1–8.0)
Sodium: 142 mmol/L (ref 133–145)

## 2016-07-21 LAB — LDL-P HDL (HDL ONLY)
HDL HDL-P: 28.1 umol/L — ABNORMAL LOW (ref >38.0)
HDL LDL-P: 867 nmol/L (ref <1020)
HDL SLDL-P: 320 nmol/L (ref <501)

## 2016-07-21 LAB — CRP, HIGH SENSITIVITY: HSCRP-TX: 0.6 mg/L (ref <5.0)

## 2016-07-21 LAB — STEROLS (HDL ONLY)
Campesterol: 0.9 ug/mL — ABNORMAL LOW (ref 2.11–4.43)
Cholestanol: 1.46 ug/mL — ABNORMAL LOW (ref 2.02–3.47)
Desmosterol: <0.5 ug/mL — ABNORMAL LOW (ref 0.50–1.27)
Sitosterol: 0.93 ug/mL — ABNORMAL LOW (ref 1.43–3.17)

## 2016-07-21 LAB — VITAMIN D, 25 HYDROXY (HDL ONLY): Vitamin D 25-Hydroxy: 44 ng/mL (ref 30–100)

## 2016-07-21 LAB — APOLIPOPROTEIN B (HDL ONLY): Apolipoprotein B: 66 mg/dL (ref <81)

## 2016-07-21 LAB — LIPID PANEL (HDL ONLY)
Cholesterol, total: 136 mg/dL (ref <200)
HDL Cholesterol: 44 mg/dL (ref >40)
LDL, calculated: 73 mg/dL (ref <100)
N-HDL-C: 92 mg/dL (ref <130)
Triglyceride: 93 mg/dL (ref <150)

## 2016-07-21 LAB — SMALL DENSE LDL (HDL ONLY): SDLDL: 18.1 mg/dL (ref <20.0)

## 2016-07-21 LAB — NT-PROBNP (HDL ONLY): Pro BNP,NT: 498 pg/mL — ABNORMAL HIGH (ref <125)

## 2016-07-22 LAB — BLOOD FATTY ACIDS PROFILE (HDL ONLY)
AA2: 16.089 % (ref 10.500–23.300)
Alpha linoleic acid n3: 0.124 % (ref 0.100–0.400)
Cis-mono-unsaturated fatty acid total: 15.6 (ref 11.5–20.5)
Docosahexaenoic acid n3: 6.202 % (ref 0.100–8.400)
Docosapentaenoic acid n3: 3.206 % (ref 0.600–4.100)
Docosapentaenoic acid n6: 0.621 % (ref 0.100–1.300)
EPA2: 1.172 % (ref 0.100–2.500)
Linoleic acid c6: 10.195 % (ref 4.600–21.300)
Omega-3 Fatty Acid total: 10.7 (ref 0.1–14.1)
Omega-3 index: 7.4 (ref 0.1–10.4)
Omega-6 Fatty Acid total: 31.2 (ref 28.6–44.5)
Saturated fatty acid total: 41.8 (ref 36.6–42.0)
Trans fatty acid total: 0.8 (ref 0.1–1.8)
Translinoleic acid: <0.1 % (ref 0.1–0.5)
Transoleic acid: 0.516 % (ref 0.100–1.300)

## 2016-07-25 ENCOUNTER — Ambulatory Visit: Admit: 2016-07-25 | Discharge: 2016-07-25 | Payer: MEDICARE | Attending: Specialist

## 2016-07-25 DIAGNOSIS — R7989 Other specified abnormal findings of blood chemistry: Secondary | ICD-10-CM

## 2016-07-25 NOTE — Progress Notes (Signed)
HISTORY OF PRESENT ILLNESS  Edward Villarreal is a 70 y.o. male. Last seen 5 months ago.    Problem List  Date Reviewed: 08-Jul-2015          Codes Class Noted    Elevated brain natriuretic peptide (BNP) level ICD-10-CM: R79.89  ICD-9-CM: 790.99  07/25/2016        Insulin resistance ICD-10-CM: E88.81  ICD-9-CM: 277.7  07/07/2014        Dyslipidemia ICD-10-CM: E78.5  ICD-9-CM: 272.4  05/07/2014        Agatston CAC score, <100 ICD-10-CM: R93.1  ICD-9-CM: 793.2  02/05/2013        Sinus bradycardia ICD-10-CM: R00.1  ICD-9-CM: 427.89  03/07/2012            Cardiac testing  CAC 09/27/2008 - 42.7  Exercise cardiolite 02/2011 - 12 min, normal perfusion, EF 59%     HPI    Edward Villarreal feels well overall. He was recently able to walk 9 holes of golf for the first time in a long time. He will develop hip tightness after walking which will limit further activity but admits that he does not stretch.     He only takes Propanolol when he plays golf. He states his HR is always slow. He notices lightheadedness if he runs up stairs or over exerts himself. He recalls that during his last stress test it took a long time to get his HR up. Patient denies any exertional chest pain, dyspnea, palpitations, syncope, orthopnea, edema or paroxysmal nocturnal dyspnea.    Of note, he will be heading to [place] for the winter in 5 days.    Cardiac risk factors   HTN no  DM no  Smoking no  Family hx of CAD yes - father with CABG      Past Medical History:   Diagnosis Date   ??? CAD (coronary artery disease)     CAC 42 Dec 2009   ??? DJD (degenerative joint disease)     L4-L5, tx with PT and nerve blocks   ??? GERD (gastroesophageal reflux disease)    ??? Hyperlipidemia    ??? IGT (impaired glucose tolerance)    ??? Mixed hyperlipidemia    ??? Sinus bradycardia        Current Outpatient Prescriptions   Medication Sig Dispense Refill   ??? metFORMIN ER (GLUCOPHAGE XR) 500 mg tablet Take 3 Tabs by mouth daily (with dinner). 270 Tab 3    ??? atorvastatin (LIPITOR) 20 mg tablet Take 1 Tab by mouth daily. 90 Tab 3   ??? ZETIA 10 mg tablet Take 1 Tab by mouth daily. May dispense Brand or generic prescription. 90 Tab 3   ??? propranolol (INDERAL) 80 mg tablet Take 0.5 Tabs by mouth as needed. 30 Tab 5   ??? cholecalciferol (VITAMIN D3) 1,000 unit cap Take 4,000 Units by mouth daily.     ??? DOCOSAHEXANOIC ACID/EPA (FISH OIL PO) Take  by mouth three (3) times daily. Mornings     ??? aspirin delayed-release 81 mg tablet Take  by mouth daily.     ??? ranitidine (ZANTAC) 75 mg tablet Take 150 mg by mouth as needed.       Allergies   Allergen Reactions   ??? Other Plant, Educational psychologist, Environmental Anaphylaxis     Fire ants       Married. Retired - Garment/textile technologist. Nonsmoker.     Review of Systems  Constitutional: Negative for fever, chills, malaise/fatigue and diaphoresis.  Respiratory: Negative for cough, hemoptysis, sputum production, shortness of breath and wheezing.    Cardiovascular: Negative for chest pain, palpitations, orthopnea, claudication, leg swelling and PND.   Gastrointestinal: Negative for heartburn, nausea, vomiting, blood in stool and melena.   Genitourinary: Negative for dysuria and flank pain.   Musculoskeletal: Negative for joint pain and back pain. +hip tightness  Skin: Negative for rash.   Neurological: Negative for focal weakness, seizures, loss of consciousness, weakness and headaches. +lightheadedness with exertion  Endo/Heme/Allergies: Does not bruise/bleed easily.   Psychiatric/Behavioral: Negative for memory loss. The patient does not have insomnia.        Visit Vitals   ??? BP 142/64 (BP 1 Location: Left arm, BP Patient Position: Sitting)   ??? Pulse (!) 56   ??? Resp 18   ??? Ht 5\' 2"  (1.575 m)   ??? Wt 173 lb (78.5 kg)   ??? SpO2 97%   ??? BMI 31.64 kg/m2     Wt Readings from Last 3 Encounters:   07/25/16 173 lb (78.5 kg)   03/02/16 176 lb (79.8 kg)   07/05/15 172 lb (78 kg)     Physical Exam   General - well developed well nourished   Neck - JVP normal, thyroid normal  Cardiac - normal S1,S2, no rubs or gallops. No clicks. Grade 2 early peaking systolic ejection murmur  Vascular - carotids without bruits, radials, femorals and pedal pulses equal bilateral  Lungs - clear to auscultation bilaterals, no rales, wheezing or rhonchi  Abd - soft nontender, no HSM, no abd bruits  Extremities - no edema  Skin - no rash  Neuro - nonfocal  Psych - normal mood and affect    Cardiographics  EKG 05/07/14 - sinus 58, normal  EKG 03/15/15 - SB 52, normal  EKG 07/25/16- SB 50, 1st degree AV block PR 206, otherwise normal. Compared to 07/05/15, PR has increased from 193 to 206    Labs drawn 06/21/15   ?? High Risk Intermediate Risk Optimal   Total Cholesterol ?? ?? 131   LDL ?? ?? 72   HDL ?? ?? 52   TG ?? ?? 90   Non-HDL ?? ?? 79   Apo B ?? ?? 59   LDL-P ?? ?? 784   Small LDL-P ?? ?? 439   sdLDL-C ?? ?? 20   Apo A-I ?? ?? 152   HDL-P ?? 37.4 ??   HDL2-C 8 ?? ??   Apo B: Apo A-I ratio ?? ?? 0.39   Lp(a)-P ?? ?? ??   Lp Cholesterol ?? ?? ??   Myeloperoxidase ?? ?? ??   Lp-PLA ?? ?? ??   Hs-CRP ?? ?? ??   Fibrinogen ?? ?? ??   proBNP ?? ?? ??   AspirinWorks ?? ?? ??   Apolipoprotein E ?? ?? ??   ZOX0R60*4*5* ?? ?? ??   CYP2C19*17* ?? ?? ??   Factor V Leiden ?? ?? ??   Prothrombin Mutation ?? ?? ??   Insulin ?? ?? ??   Free Fatty Acid ?? ?? ??   Glucose ?? ?? ??   HbA1c ?? ?? ??   Estimated Average Glucose ?? ?? ??   25-hydroxy-Vitamin D ?? ?? ??   TSH ?? ?? ??   Homocysteine ?? ?? ??   Creatinine, serum ?? ?? ??   Campesterol ?? ?? ??   Campesterol Ratio ?? ?? ??   Sitosterol ?? ?? ??   Sitosterol Ratio ?? ?? ??  Cholestanol ?? ?? ??   Cholestanol Ratio ?? ?? ??   Desmosterol ?? ?? ??   Omega 3 ?? ?? ??       ASSESSMENT and PLAN  Encounter Diagnoses   Name Primary?   ??? Agatston CAC score, <100 Yes   ??? Insulin resistance    ??? Dyslipidemia    ??? Elevated brain natriuretic peptide (BNP) level      [name] has subclinical CAD with low calcium score by CT heart scan 2009. Stress nuclear study 2012 was normal. He has no sxs angina at a good functional capacity.      He has sinus node dysfunction with mild PR prolongation, asx. Reviewed sxs of bradycardia and chronotropic incompetence. He has signficant interval elevation in proBNP without clearcut HF sxs. Evaluate further with echo.     Advanced lipid testing results are incomplete. Lipids are normal. Will phone follow up the remainder of his labs when available.      Follow-up Disposition:  Return in about 6 months (around 01/23/2017).     Written by Joycelyn Ruaatherine Smiley, ScribeKick, as dictated by Doreene AdasShaival Gyasi Hazzard, MD.   Doreene AdasShaival Joscelyne Renville, MD

## 2016-07-26 ENCOUNTER — Encounter: Attending: Nurse Practitioner

## 2016-07-27 ENCOUNTER — Encounter: Attending: Specialist

## 2016-07-30 ENCOUNTER — Institutional Professional Consult (permissible substitution): Admit: 2016-07-30 | Payer: MEDICARE

## 2016-07-30 ENCOUNTER — Encounter

## 2016-07-30 DIAGNOSIS — R011 Cardiac murmur, unspecified: Secondary | ICD-10-CM

## 2016-07-30 NOTE — Progress Notes (Signed)
The patient was identified by name and date of birth. The test was explained to patient and questions were answered prior to testing.

## 2016-07-31 LAB — ECHOCARDIOGRAM COMPLETE 2D W DOPPLER W COLOR: Left Ventricular Ejection Fraction: 58

## 2016-08-01 NOTE — Telephone Encounter (Signed)
Nils FlackAndrea L Barden, NP  Milus GlazierLinda K Angla Delahunt, LPN   ??    ??    ??   ?? Cardiac testing   Echo 07/30/16 - EF 55-60%. No WMA. Mild LVH.     Please notify [name] that his Echo showed normal heart pump function. No data to support the elevation in his heart failure marker on recent lab work. ??Will simply repeat it again in 6 months to trend the number.     Please have him call us with any questions or concerns prior to his next office visit.       Nils FlackAndrea L Barden, NP  Milus GlazierLinda K Jaydon Avina, LPN   ??    ??    ??   ?? Particle sizes remain at goal with normal lipid studies on current statin therapy. Vitamin D level at goal. Sterol absorption markers are appropriately suppressed due to Zetia. Glucose level is normal on current Metformin dose - they reported they were unable to run the Insulin level. Kidney and liver function are normal.       Patient notified of results. He voices understanding.

## 2016-08-01 NOTE — Progress Notes (Signed)
Labs drawn 07/18/16       High Risk Intermediate Risk Optimal   Total Cholesterol   136   LDL   73   HDL   44   TG   93   Non-HDL   92   Apo B   66   LDL-P   867   Small LDL-P   320   sdLDL-C   18   Apo A-I  128    HDL-P 28.1     HDL2-C   14   Apo B: Apo A-I ratio   0.51   Lp(a)-P   <50   Hs-CRP   0.6   Lp-PLA2      NT-proBNP 498     Apolipoprotein E      ZOX0R60*4*5CYP2C19*2*3*      CYP2C19*17*      Factor V Leiden      Prothrombin Mutation      MTHFR      Vitamin D   44   Creatinine, serum   1.0   Campesterol   0.90 (hypo)   Sitosterol   0.93(hypo)   Cholestanol   1.46(hypo)   Desmosterol   <0.50(hypo)   Glucose   89   Free Fatty Acid      Insulin      Omega 3   7.4

## 2016-08-07 MED ORDER — METFORMIN SR 500 MG 24 HR TABLET
500 mg | ORAL_TABLET | Freq: Every day | ORAL | 3 refills | Status: DC
Start: 2016-08-07 — End: 2017-04-25

## 2016-09-04 NOTE — Telephone Encounter (Signed)
Results mailed.

## 2016-09-04 NOTE — Telephone Encounter (Signed)
Patient requests that lab results from this year are mailed to him at [address] [place], [place] [id number]. Can be reached at [phone].Thanks!

## 2016-09-05 ENCOUNTER — Encounter: Attending: Specialist

## 2017-03-18 ENCOUNTER — Encounter: Attending: Specialist

## 2017-04-15 NOTE — Telephone Encounter (Signed)
Pt called to schedule his annual appointment with Dr. Milbert CoulterKapadia or Pattricia BossAnnie. I didn't see any openings for Pattricia Bossnnie after July so the pt is scheduled for 9/5. Pt would like to see Pattricia Bossnnie before that if possible because he is having his blood work done soon. He can be reached @ [phone].     Thanks

## 2017-04-16 LAB — LIPID PANEL (HDL ONLY)
Cholesterol, total: 136 mg/dL (ref <200)
HDL Cholesterol: 44 mg/dL (ref >39)
LDL, calculated: 81 mg/dL (ref <100)
Non-HDL Cholesterol: 92 mg/dL (ref <130)
Triglyceride: 116 mg/dL (ref <150)

## 2017-04-16 LAB — FATTY ACIDS, FREE (HDL ONLY): FFA free fatty acids: 0.36 mmol/L (ref <0.60)

## 2017-04-16 LAB — HDL 2 SUBCLASS (HDL ONLY): HDL 2 subclass: 8 mg/dL — ABNORMAL LOW (ref >11)

## 2017-04-16 LAB — CRP, HIGH SENSITIVITY: CRP, High sensitivity: 1.3 mg/L (ref <5.0)

## 2017-04-16 LAB — SMALL DENSE LDL (HDL ONLY): Small Dense LDL: 20 mg/dL (ref <21)

## 2017-04-16 LAB — APOLIPOPROTEIN A-1 (HDL ONLY): Apolipoprotein A-1: 135 mg/dL (ref >131)

## 2017-04-16 LAB — VITAMIN D, 25 HYDROXY (HDL ONLY): Vit D 25-Hydroxy: 54 ng/mL (ref 30–100)

## 2017-04-17 LAB — LDL-P HDL (HDL ONLY)
HDL HDL-P: 33.3 umol/L — ABNORMAL LOW (ref >38.0)
HDL LDL-P: 1080 nmol/L — ABNORMAL HIGH (ref <1020)
HDL SLDL-P: 678 nmol/L — ABNORMAL HIGH (ref <501)

## 2017-04-17 LAB — APOLIPOPROTEIN B (HDL ONLY): Apolipoprotein B: 67 mg/dL (ref <81)

## 2017-04-17 LAB — METABOLIC PANEL, COMPREHENSIVE (HDL ONLY)
% ALBUMIN: 65 % (ref 54–71)
ALB/GLOBRATIO: 1.87 (ref 1.15–2.50)
ALT (SGPT): 17 U/L (ref <42)
AST (SGOT): 21 U/L (ref 5–40)
Albumin: 4.6 g/dL (ref 3.7–5.1)
Alk. phosphatase: 72 U/L (ref 35–117)
Anion gap: 14 (ref 6–18)
BUN/Creatinine ratio: 20 (ref 10–27)
BUN: 21 mg/dL — ABNORMAL HIGH (ref 6–20)
Bilirubin, total: 1.2 mg/dL (ref <1.3)
CO2: 22 mmol/L (ref 19–31)
Calcium: 9.2 mg/dL (ref 8.8–10.5)
Chloride: 107 mmol/L (ref 98–110)
Creatinine: 1 mg/dL (ref 0.7–1.2)
GLOBCALC: 2.5 g/dL (ref 1.9–3.5)
Glucose: 104 mg/dL — ABNORMAL HIGH (ref 70–99)
Potassium: 5.3 mmol/L (ref 3.5–5.3)
Protein, total: 7.1 g/dL (ref 6.1–8.0)
Sodium: 144 mmol/L (ref 133–145)

## 2017-04-17 LAB — LIPOPROTEIN ASSOC PHOSPHOLIPID (HDL ONLY): Lp-PLA2: 128 ng/mL (ref <291)

## 2017-04-17 LAB — INSULIN (HDL ONLY): Insulin: 9 uU/mL (ref 3–9)

## 2017-04-17 LAB — NT-PROBNP (HDL ONLY): Pro BNP,NT: 88 pg/mL (ref <125)

## 2017-04-18 NOTE — Telephone Encounter (Signed)
Spoke with Edward Villarreal a t [facility] and verified lab orders.

## 2017-04-18 NOTE — Telephone Encounter (Signed)
Orpha BurKaty from [facility] called to verify pt lab order.  Orpha BurKaty can be reached at [phone] ext #[phone].  Thanks

## 2017-04-19 LAB — STEROLS (HDL ONLY)
Campesterol: 1.77 ug/mL — ABNORMAL LOW (ref 2.11–4.43)
Cholestanol: 2.04 ug/mL (ref 2.02–3.47)
Desmosterol: 0.72 ug/mL (ref <1.28)
Sitosterol: 1.67 ug/mL (ref 1.43–3.17)

## 2017-04-19 LAB — BLOOD FATTY ACIDS PROFILE (HDL ONLY)
AA2: 16.979 % (ref 10.500–23.300)
Alpha linoleic acid n3: 0.131 % (ref 0.100–0.400)
Cis-mono-unsaturated fatty acid total: 15.4 (ref 11.5–20.5)
Docosahexaenoic acid n3: 5.373 % (ref 0.100–8.400)
Docosapentaenoic acid n3: 2.682 % (ref 0.600–4.100)
Docosapentaenoic acid n6: 0.712 % (ref 0.100–1.300)
EPA2: 1.026 % (ref 0.100–2.500)
Linoleic acid c6: 9.427 % (ref 4.600–21.300)
Omega-3 Fatty Acid total: 9.2 (ref 0.1–14.1)
Omega-3 index: 6.4 (ref 0.1–10.4)
Omega-6 Fatty Acid total: 33 (ref 28.6–44.5)
Saturated fatty acid total: 41.8 (ref 36.6–42.0)
Trans fatty acid total: 0.7 (ref 0.1–1.8)
Translinoleic acid: <0.1 % (ref 0.1–0.5)
Transoleic acid: 0.482 % (ref 0.100–1.300)

## 2017-04-19 NOTE — Telephone Encounter (Signed)
Pt called and confirmed the appointment pn 7/27. Pt states that he will call throughout the week next week to see if anyone cancels.     Thanks

## 2017-04-19 NOTE — Telephone Encounter (Signed)
Pt's need an appointment sooner then 09/18 and with Dr.Kapadia only.          Scheduler can you call pt and reschedule something in the next 3 weeks with Dr. Milbert CoulterKapadia only.   Thank you

## 2017-04-22 ENCOUNTER — Ambulatory Visit: Admit: 2017-04-22 | Discharge: 2017-04-22 | Payer: MEDICARE | Attending: Specialist

## 2017-04-22 ENCOUNTER — Encounter

## 2017-04-22 DIAGNOSIS — I2510 Atherosclerotic heart disease of native coronary artery without angina pectoris: Secondary | ICD-10-CM

## 2017-04-22 NOTE — Progress Notes (Signed)
HISTORY OF PRESENT ILLNESS  Edward Villarreal is a 71 y.o. male. Last seen 8 months ago.    Problem List  Date Reviewed: 07/05/2015          Codes Class Noted    Elevated brain natriuretic peptide (BNP) level ICD-10-CM: R79.89  ICD-9-CM: 790.99  07/25/2016        Insulin resistance ICD-10-CM: E88.81  ICD-9-CM: 277.7  07/07/2014        Dyslipidemia ICD-10-CM: E78.5  ICD-9-CM: 272.4  05/07/2014        Agatston CAC score, <100 ICD-10-CM: R93.1  ICD-9-CM: 793.2  02/05/2013        Sinus bradycardia ICD-10-CM: R00.1  ICD-9-CM: 427.89  03/07/2012            Cardiac testing  CAC 09/27/2008 - 42.7  Exercise cardiolite 02/2011 - 12 min, normal perfusion, EF 59%  Echo 07/30/16 - EF 55-60%. No WMA. Mild LVH.     HPI  Edward Villarreal reports he feels well overall. He frequently plays golf for exercise and has an active lifestyle (mows his lawn, ect.). He reports some lightheadedness/nausea with prolonged activity, which he attributes to the hot weather.  Patient has been trying to stay hydrated.     Patient states he frequently "dozes off" at home. He notes sleepiness does not interfere with his daily activities. He sleeps well at night.     Edward Villarreal states his BPs at home are in the 130 systolic range. He is adherent with all his medications.     Patient denies any exertional chest pain, dyspnea, palpitations, syncope, orthopnea, edema or paroxysmal nocturnal dyspnea.    Of note, [place] in [place] months out of the year.     Cardiac risk factors   HTN no  DM no  Smoking no  Family hx of CAD yes - father with CABG      Past Medical History:   Diagnosis Date   ??? CAD (coronary artery disease)     CAC 42 Dec 2009   ??? DJD (degenerative joint disease)     L4-L5, tx with PT and nerve blocks   ??? GERD (gastroesophageal reflux disease)    ??? Hyperlipidemia    ??? IGT (impaired glucose tolerance)    ??? Mixed hyperlipidemia    ??? Sinus bradycardia        Current Outpatient Prescriptions   Medication Sig Dispense Refill    ??? tamsulosin (FLOMAX) 0.4 mg capsule Take 0.4 mg by mouth daily.     ??? trimethoprim-sulfamethoxazole (BACTRIM DS) 160-800 mg per tablet Take 1 Tab by mouth two (2) times a day.     ??? propranolol (INDERAL) 80 mg tablet Take 0.5 Tabs by mouth as needed. 30 Tab 5   ??? cholecalciferol (VITAMIN D3) 1,000 unit cap Take 4,000 Units by mouth daily.     ??? DOCOSAHEXANOIC ACID/EPA (FISH OIL PO) Take  by mouth three (3) times daily. Mornings     ??? aspirin delayed-release 81 mg tablet Take  by mouth daily.     ??? ranitidine (ZANTAC) 75 mg tablet Take 150 mg by mouth as needed.     ??? ZETIA 10 mg tablet Take 1 Tab by mouth daily. May dispense Brand or generic prescription. 90 Tab 3   ??? metFORMIN ER (GLUCOPHAGE XR) 500 mg tablet Take 3 Tabs by mouth daily (with dinner). 270 Tab 3   ??? atorvastatin (LIPITOR) 20 mg tablet Take 1 Tab by mouth daily. 90 Tab  3     Allergies   Allergen Reactions   ??? Other Plant, Educational psychologist, Environmental Anaphylaxis     Fire ants       Married. Retired - Garment/textile technologist. Nonsmoker.     Review of Systems  Constitutional: Negative for fever, chills, malaise/fatigue and diaphoresis.   Respiratory: Negative for cough, hemoptysis, sputum production, shortness of breath and wheezing.    Cardiovascular: Negative for chest pain, palpitations, orthopnea, claudication, leg swelling and PND.   Gastrointestinal: Negative for heartburn, nausea, vomiting, blood in stool and melena.   Genitourinary: Negative for dysuria and flank pain.   Musculoskeletal: Negative for joint pain and back pain.   Skin: Negative for rash.   Neurological: Negative for focal weakness, seizures, loss of consciousness, weakness and headaches. +lightheadedness with exertion  Endo/Heme/Allergies: Does not bruise/bleed easily.   Psychiatric/Behavioral: Negative for memory loss. The patient does not have insomnia.        Visit Vitals   ??? BP 140/62   ??? Pulse 60   ??? Ht 5\' 2"  (1.575 m)   ??? Wt 172 lb (78 kg)   ??? BMI 31.46 kg/m2      Wt Readings from Last 3 Encounters:   04/22/17 172 lb (78 kg)   07/25/16 173 lb (78.5 kg)   03/02/16 176 lb (79.8 kg)     Physical Exam   General - well developed well nourished  Neck - JVP normal, thyroid normal  Cardiac - normal S1,S2, no rubs or gallops. No clicks. Grade 2 early peaking systolic ejection murmur  Vascular - carotids without bruits, radials, femorals and pedal pulses equal bilateral  Lungs - clear to auscultation bilaterals, no rales, wheezing or rhonchi  Abd - soft nontender, no HSM, no abd bruits  Extremities - no edema  Skin - no rash  Neuro - nonfocal  Psych - normal mood and affect    Cardiographics  EKG 05/07/14 - sinus 58, normal  EKG 03/15/15 - SB 52, normal  EKG 07/25/16- SB 50, 1st degree AV block PR 206, otherwise normal. Compared to 07/05/15, PR has increased from 193 to 206    Labs drawn 06/21/15   ?? High Risk Intermediate Risk Optimal   Total Cholesterol ?? ?? 131   LDL ?? ?? 72   HDL ?? ?? 52   TG ?? ?? 90   Non-HDL ?? ?? 79   Apo B ?? ?? 59   LDL-P ?? ?? 784   Small LDL-P ?? ?? 439   sdLDL-C ?? ?? 20   Apo A-I ?? ?? 152   HDL-P ?? 37.4 ??   HDL2-C 8 ?? ??   Apo B: Apo A-I ratio ?? ?? 0.39   Lp(a)-P ?? ?? ??   Lp Cholesterol ?? ?? ??   Myeloperoxidase ?? ?? ??   Lp-PLA ?? ?? ??   Hs-CRP ?? ?? ??   Fibrinogen ?? ?? ??   proBNP ?? ?? ??   AspirinWorks ?? ?? ??   Apolipoprotein E ?? ?? ??   ZOX0R60*4*5* ?? ?? ??   CYP2C19*17* ?? ?? ??   Factor V Leiden ?? ?? ??   Prothrombin Mutation ?? ?? ??   Insulin ?? ?? ??   Free Fatty Acid ?? ?? ??   Glucose ?? ?? ??   HbA1c ?? ?? ??   Estimated Average Glucose ?? ?? ??   25-hydroxy-Vitamin D ?? ?? ??   TSH ?? ?? ??   Homocysteine ?? ?? ??  Creatinine, serum ?? ?? ??   Campesterol ?? ?? ??   Campesterol Ratio ?? ?? ??   Sitosterol ?? ?? ??   Sitosterol Ratio ?? ?? ??   Cholestanol ?? ?? ??   Cholestanol Ratio ?? ?? ??   Desmosterol ?? ?? ??   Omega 3 ?? ?? ??       ASSESSMENT and PLAN  Encounter Diagnoses   Name Primary?   ??? Coronary artery disease involving native coronary artery of native heart without angina pectoris Yes   ??? Insulin resistance     ??? Dyslipidemia    ??? Agatston CAC score, <100    ??? Sinus bradycardia         Edward Villarreal has subclinical CAD with low calcium score by CT heart scan 2009. Stress nuclear study 2012 was normal. He has no sxs of angina at a good functional capacity.     He has sinus node dysfunction with mild PR prolongation, asx. Reviewed sxs of bradycardia and chronotropic incompetence.     Spurious elevation in Pro BNP 6 months ago, now normalized. Echo 6 months ago was normal.     Advanced lipid testing demonstrates optimized lipids and lipoproteins. Other markers are fine. Continue combination therapy. Repeat numbers in one year.      Follow-up Disposition:  Return in about 1 year (around 04/22/2018).       Written by Valerie Salts, Scribekick, as dictated by Dr. Doreene Adas.     Doreene Adas, MD

## 2017-04-25 ENCOUNTER — Encounter

## 2017-04-25 MED ORDER — ZETIA 10 MG TABLET
10 mg | ORAL_TABLET | Freq: Every day | ORAL | 3 refills | Status: DC
Start: 2017-04-25 — End: 2018-04-29

## 2017-04-25 MED ORDER — METFORMIN SR 500 MG 24 HR TABLET
500 mg | ORAL_TABLET | Freq: Every day | ORAL | 3 refills | Status: DC
Start: 2017-04-25 — End: 2018-04-29

## 2017-04-25 MED ORDER — ATORVASTATIN 20 MG TAB
20 mg | ORAL_TABLET | Freq: Every day | ORAL | 3 refills | Status: DC
Start: 2017-04-25 — End: 2018-04-29

## 2017-04-25 NOTE — Telephone Encounter (Signed)
[place] fax to [place] VO Dr Milbert CoulterKapadia

## 2017-04-25 NOTE — Progress Notes (Signed)
Labs drawn 04/15/17       High Risk Intermediate Risk Optimal   Total Cholesterol   136   LDL   81   HDL   44   TG   116   Non-HDL   92   Apo B   67   LDL-P  1080    Small LDL-P  678    sdLDL-C   20   Apo A-I   135   HDL-P 33.3     HDL2-C 8     Apo B: Apo A-I ratio      Lp(a)-P      Hs-CRP  1.3    Lp-PLA2   128   NT-proBNP   88   Apolipoprotein E      ZOX0R60*4*5CYP2C19*2*3*      CYP2C19*17*      Factor V Leiden      Prothrombin Mutation      MTHFR      25-hydroxy-Vitamin D   54   Creatinine, serum   1.0   Campesterol   1.77 (hypo)   Sitosterol   1.67   Cholestanol   2.04   Desmosterol   0.72   Glucose  104    Free Fatty Acid   0.36   Insulin   9   Omega 3  6.4

## 2017-04-25 NOTE — Telephone Encounter (Signed)
rx approved by Dr Kapadia

## 2017-05-10 ENCOUNTER — Encounter: Attending: Specialist

## 2017-05-20 ENCOUNTER — Encounter: Attending: Specialist

## 2017-06-19 ENCOUNTER — Encounter: Attending: Specialist

## 2018-04-01 LAB — LIPID PANEL (HDL ONLY)
Cholesterol, total: 131 mg/dL (ref <200)
HDL-C: 50 mg/dL (ref >39)
LDL, calculated: 74 mg/dL (ref <100)
Non-HDL Cholesterol: 80 mg/dL (ref <130)
Triglyceride: 127 mg/dL (ref <150)

## 2018-04-01 LAB — HOMOCYSTEINE (HDL ONLY): Homocysteine, plasma: 13 umol/L — ABNORMAL HIGH (ref <11)

## 2018-04-01 LAB — NT-PROBNP (HDL ONLY): Pro BNP,NT: 99 pg/mL (ref <125)

## 2018-04-01 LAB — FATTY ACIDS, FREE (HDL ONLY): FFA free fatty acids: 0.75 mmol/L — ABNORMAL HIGH (ref <0.60)

## 2018-04-01 LAB — APOLIPOPROTEIN A-1 (HDL ONLY): Apolipoprotein A-1: 130 mg/dL — ABNORMAL LOW (ref >131)

## 2018-04-01 LAB — CRP, HIGH SENSITIVITY: CRP, High sensitivity: 0.2 mg/L (ref <5.0)

## 2018-04-01 LAB — INSULIN (HDL ONLY): Insulin: 8 uU/mL (ref 3–9)

## 2018-04-01 LAB — SMALL DENSE LDL (HDL ONLY): SDLDL-DIAZ: 24 mg/dL (ref <=40)

## 2018-04-01 LAB — ESTIMATED GLOMERULAR FILTRATION RATE BASED ON CREAT
EGFRAACREAT: 85 mL/min/{1.73_m2} (ref >30)
EGFRNACREAT: 73 mL/min/{1.73_m2} (ref >30)

## 2018-04-01 LAB — HDL 2 SUBCLASS (HDL ONLY): HDL2-DIAZ: 10 mg/dL — ABNORMAL LOW (ref >13)

## 2018-04-02 ENCOUNTER — Encounter

## 2018-04-02 LAB — METABOLIC PANEL, COMPREHENSIVE (HDL ONLY)
% ALBUMIN: 63 % (ref 54–71)
ALB/GLOBRATIO: 1.69 (ref 1.15–2.50)
ALT (SGPT): 16 U/L (ref <42)
AST (SGOT): 19 U/L (ref 5–40)
Albumin: 4.6 g/dL (ref 3.7–5.1)
Alk. phosphatase: 68 U/L (ref 35–117)
Anion gap: 11 (ref 6–18)
BUN/Creatinine ratio: 24 (ref 10–29)
BUN: 25 mg/dL — ABNORMAL HIGH (ref 6–20)
Bilirubin, total: 1.4 mg/dL — ABNORMAL HIGH (ref <1.3)
CO2: 23 mmol/L (ref 19–31)
Calcium: 9.6 mg/dL (ref 8.8–10.5)
Chloride: 105 mmol/L (ref 98–110)
Creatinine: 1 mg/dL (ref 0.7–1.2)
GLOBCALC: 2.7 g/dL (ref 1.9–3.5)
Glucose: 107 mg/dL — ABNORMAL HIGH (ref 70–99)
Potassium: 4.4 mmol/L (ref 3.5–5.3)
Protein, total: 7.3 g/dL (ref 6.1–8.0)
Sodium: 140 mmol/L (ref 133–145)

## 2018-04-02 LAB — BLOOD FATTY ACIDS PROFILE (HDL ONLY)
AA2: 16.336 % (ref 10.500–23.300)
Alpha linoleic acid n3: <0.1 % (ref 0.1–0.4)
Cis-mono-unsaturated fatty acid total: 16.3 (ref 11.5–20.5)
Docosahexaenoic acid n3: 4.604 % (ref 0.100–8.400)
Docosapentaenoic acid n3: 2.41 % (ref 0.600–4.100)
Docosapentaenoic acid n6: 0.773 % (ref 0.100–1.300)
EPA2: 0.505 % (ref 0.100–2.500)
Linoleic acid c6: 9.448 % (ref 4.600–21.300)
Omega-3 Fatty Acid total: 7.6 (ref 0.1–14.1)
Omega-3 index: 5.1 (ref 0.1–10.4)
Omega-6 Fatty Acid total: 32.6 (ref 28.6–44.5)
Saturated fatty acid total: 42.8 — ABNORMAL HIGH (ref 36.6–42.0)
Trans fatty acid total: 0.7 (ref 0.1–1.8)
Translinoleic acid: 0.103 % (ref 0.100–0.500)
Transoleic acid: 0.432 % (ref 0.100–1.300)

## 2018-04-02 LAB — LDL-P HDL (HDL ONLY)
HDL HDL-P: 37 umol/L — ABNORMAL LOW (ref >38.0)
HDL LDL-P: 1084 nmol/L — ABNORMAL HIGH (ref <1020)
HDL SLDL-P: 608 nmol/L — ABNORMAL HIGH (ref <501)

## 2018-04-02 LAB — APOLIPOPROTEIN B (HDL ONLY): Apolipoprotein B: 72 mg/dL (ref <81)

## 2018-04-02 LAB — VITAMIN D, 25 HYDROXY (HDL ONLY): Vit D 25-Hydroxy: 54 ng/mL (ref 30–100)

## 2018-04-02 LAB — MYELOPEROXIDASE, AB (HDL ONLY): MPOAU: 237 pmol/L (ref <256)

## 2018-04-02 LAB — LIPOPROTEIN ASSOC PHOSPHOLIPID (HDL ONLY): LP-PLA2 ACTIVITY: 126 (nmol/min/mL) — ABNORMAL LOW (ref 196–225)

## 2018-04-03 LAB — STEROLS (HDL ONLY)
Campesterol: 2.07 ug/mL — ABNORMAL LOW (ref 2.11–4.43)
Cholestanol: 2.47 ug/mL (ref 2.02–3.47)
Desmosterol: 0.55 ug/mL (ref <1.28)
Sitosterol: 1.72 ug/mL (ref 1.43–3.17)

## 2018-04-04 LAB — LP(A)-P STAND ALONE: Lp(a)-P: 183 nmol/L — ABNORMAL HIGH (ref <75)

## 2018-04-14 ENCOUNTER — Inpatient Hospital Stay: Admit: 2018-04-14 | Payer: MEDICARE | Attending: Urology

## 2018-04-14 DIAGNOSIS — N201 Calculus of ureter: Secondary | ICD-10-CM

## 2018-04-14 MED ORDER — IOPAMIDOL 61 % IV SOLN
300 mg iodine /mL (61 %) | Freq: Once | INTRAVENOUS | Status: AC
Start: 2018-04-14 — End: 2018-04-14
  Administered 2018-04-14: 12:00:00 via INTRAVENOUS

## 2018-04-14 MED FILL — ISOVUE-300  61 % INTRAVENOUS SOLUTION: 300 mg iodine /mL (61 %) | INTRAVENOUS | Qty: 100

## 2018-04-14 NOTE — Progress Notes (Signed)
Labs drawn 03/31/18     High Risk Intermediate Risk Optimal   Total Cholesterol   131   LDL   74   HDL   50   TG   127   Non-HDL   80   Apo B   72   LDL-P  1084    Small LDL-P  608    sdLDL-C   24   Apo A-I  130    HDL-P  37.0    HDL2-C  10    Apo B: Apo A-I ratio      Lp(a)-P 183     Hs-CRP   0.2   Lp-PLA2   126   Myeloperoxidase  13    NT-proBNP   99   Apolipoprotein E      CYP2C19*2*3*      CYP2C19*17*      Factor V Leiden      Prothrombin Mutation      MTHFR      25-hydroxy-Vitamin D   54   Homocysteine  13    Creatinine, serum      Campesterol   2.07 (hypo)   Sitosterol   1.72   Cholestanol   2.47   Desmosterol   0.55   Glucose  107    Free Fatty Acid 0.75     HOMA-IR      Insulin   8   Omega 3  5.1

## 2018-04-14 NOTE — Progress Notes (Signed)
Labs drawn 03/31/18     High Risk Intermediate Risk Optimal   Total Cholesterol   131   LDL   74   HDL   50   TG   127   Non-HDL   80   Apo B   72   LDL-P  1084    Small LDL-P  608    sdLDL-C   24   Apo A-I  130    HDL-P  37.0    HDL2-C  10    Apo B: Apo A-I ratio      Lp(a)-P 183     Hs-CRP   0.2   Lp-PLA2   126   Myeloperoxidase  13    NT-proBNP   99   Apolipoprotein E      [id number]*5*4*      CYP2C19*17*      Factor V Leiden      Prothrombin Mutation      MTHFR      25-hydroxy-Vitamin D   54   Homocysteine  13    Creatinine, serum      Campesterol   2.07 (hypo)   Sitosterol   1.72   Cholestanol   2.47   Desmosterol   0.55   Glucose  107    Free Fatty Acid 0.75     HOMA-IR      Insulin   8   Omega 3  5.1

## 2018-04-28 ENCOUNTER — Encounter: Attending: Specialist

## 2018-04-29 ENCOUNTER — Ambulatory Visit: Admit: 2018-04-29 | Payer: MEDICARE | Attending: Specialist

## 2018-04-29 ENCOUNTER — Telehealth

## 2018-04-29 ENCOUNTER — Ambulatory Visit: Attending: Specialist

## 2018-04-29 DIAGNOSIS — R931 Abnormal findings on diagnostic imaging of heart and coronary circulation: Secondary | ICD-10-CM

## 2018-04-29 MED ORDER — PROPRANOLOL 80 MG TAB
80 mg | ORAL_TABLET | ORAL | 3 refills | Status: DC | PRN
Start: 2018-04-29 — End: 2018-05-05

## 2018-04-29 MED ORDER — ATORVASTATIN 40 MG TAB
40 mg | ORAL_TABLET | Freq: Every day | ORAL | 3 refills | Status: DC
Start: 2018-04-29 — End: 2019-03-30

## 2018-04-29 MED ORDER — METFORMIN SR 500 MG 24 HR TABLET
500 mg | ORAL_TABLET | Freq: Every day | ORAL | 3 refills | Status: DC
Start: 2018-04-29 — End: 2019-05-06

## 2018-04-29 MED ORDER — ZETIA 10 MG TABLET
10 mg | ORAL_TABLET | Freq: Every day | ORAL | 3 refills | Status: DC
Start: 2018-04-29 — End: 2019-05-06

## 2018-04-29 MED ORDER — PROPRANOLOL 80 MG TAB
80 mg | ORAL_TABLET | ORAL | 3 refills | Status: DC | PRN
Start: 2018-04-29 — End: 2018-04-29

## 2018-04-29 MED ORDER — RANITIDINE 150 MG TAB
150 mg | ORAL_TABLET | ORAL | 3 refills | Status: DC | PRN
Start: 2018-04-29 — End: 2019-05-06

## 2018-04-29 NOTE — Telephone Encounter (Signed)
Follow up HDL panel VO Dr.Kapadia

## 2018-04-29 NOTE — Progress Notes (Signed)
Patient says he has no cardiac complaints today  Patient is needing refills on medications       Visit Vitals  BP 140/78 (BP 1 Location: Right arm, BP Patient Position: Sitting)   Pulse 63   Ht 5' 2" (1.575 m)   Wt 169 lb 12.8 oz (77 kg)   SpO2 97%   BMI 31.06 kg/m??

## 2018-04-29 NOTE — Patient Instructions (Addendum)
-  Resume aspirin 81 mg daily after [place].   -Try freezing fish oil pills before taking them.  -Continue zetia and atorvastatin

## 2018-04-29 NOTE — Progress Notes (Signed)
Edward Durnin J. Victorio Palm, MD [facility]  [address] [place] [address]    Office [phone]  Fax [phone]  Cell [phone]     [place] is a 72 y.o. male. Last seen by me 1 year ago.    DIAGNOSES  Encounter Diagnoses     ICD-10-CM ICD-9-CM   1. Agatston CAC score, <100 R93.1 793.2   2. Dyslipidemia E78.5 272.4   3. Sinus bradycardia R00.1 427.89   4. 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 Stress nuclear study 2012 was normal. He has no sxs of angina at a good functional capacity. Resume SA 32m daily after his urology evaluation is completed.     Sinus node dysfunction with mild PR prolongation, asx. Reviewed sxs of bradycardia and chronotropic incompetence.     Dyslipidemia/IR. Updated THL demonstrate optimized lipids but intermediate risk lipoproteins, largely unchanged from 1 year ago. Sterol markers remained normal. He has persistent low grade elevation in FBS. He follows a fairly low carb diet but will try to do better. Continue combination therapy with Atorva 276m Zetia 103mand MET 1500m43m Interestingly, LPa, previously normal is elevated in this set of labs. Suspect there is an error somewhere.    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 Encouraged him to resume fish oil 3 capsules daily, but advised him to freeze the capsules to minimize fishy taste. Repeat numbers in 1 year.    Enlarged prostate. Should biopsy be required, cardiac risk is low.    Advanced lipid testing demonstrates optimized lipids and lipoproteins. Other markers are fine. Continue combination therapy. Repeat numbers in one year.     Follow-up and Dispositions    ?? Return in about 1 year (around 04/30/2019).        HPI    JohnCADE DASHNERorts that he has been feeling well. He notes attending physical therapy consistently and he plays golf daily.  Continues to take propranolol for hand tremor when plays golf. He had bladder stone  extracted recently, has an enlarged prostate, will be undergoing MRI eval and biosy. His result was evaluated    Patient denies any exertional chest pain, dyspnea, palpitations, syncope, orthopnea, edema or paroxysmal nocturnal dyspnea.    Of note, he lives in [place] 9 months out of the year.     Cardiac testing  CAC 09/27/2008 - 42.7  Exercise cardiolite 02/2011 - 12 min, normal perfusion, EF 59%  Echo 07/30/16 - EF 55-60%. No WMA. Mild LVH.      Cardiac risk factors   HTN no  DM no  Smoking no  Family hx of CAD yes - father with CABG      Past Medical History:   Diagnosis Date   ??? CAD (coronary artery disease)     CAC 42 Dec 2009   ??? DJD (degenerative joint disease)     L4-L5, tx with PT and nerve blocks   ??? GERD (gastroesophageal reflux disease)    ??? Hyperlipidemia    ??? IGT (impaired glucose tolerance)    ??? Mixed hyperlipidemia    ??? Sinus bradycardia        Current Outpatient Medications   Medication Sig Dispense Refill   ??? propranolol (INDERAL) 80 mg tablet Take 1 Tab by mouth as needed (as needed). 90 Tab 3   ??? tamsulosin (FLOMAX) 0.4 mg capsule Take  0.4 mg by mouth daily.     ??? cholecalciferol (VITAMIN D3) 1,000 unit cap Take 4,000 Units by mouth daily.     ??? metFORMIN ER (GLUCOPHAGE XR) 500 mg tablet Take 3 Tabs by mouth daily (with dinner). 270 Tab 3   ??? atorvastatin (LIPITOR) 40 mg tablet Take 0.5 Tabs by mouth daily. 90 Tab 3   ??? ZETIA 10 mg tablet Take 1 Tab by mouth daily. May dispense Brand or generic prescription. 90 Tab 3   ??? raNITIdine (ZANTAC) 150 mg tablet Take 1 Tab by mouth as needed (as needed). 90 Tab 3   ??? DOCOSAHEXANOIC ACID/EPA (FISH OIL PO) Take  by mouth three (3) times daily. Mornings     ??? aspirin delayed-release 81 mg tablet Take  by mouth daily.       Allergies   Allergen Reactions   ??? Other Plant, Higher education careers adviser, Environmental Anaphylaxis     Fire ants       Married. Retired - Paediatric nurse. Nonsmoker.     Review of Systems   Constitutional: Negative for fever, chills, malaise/fatigue and diaphoresis.   Respiratory: Negative for cough, hemoptysis, sputum production, shortness of breath and wheezing.    Cardiovascular: Negative for chest pain, palpitations, orthopnea, claudication, leg swelling and PND.   Gastrointestinal: Negative for heartburn, nausea, vomiting, blood in stool and melena.   Genitourinary: Negative for dysuria and flank pain.   Musculoskeletal: Negative for joint pain and back pain.   Skin: Negative for rash.   Neurological: Negative for focal weakness, seizures, loss of consciousness, weakness and headaches. +lightheadedness with exertion  Endo/Heme/Allergies: Does not bruise/bleed easily.   Psychiatric/Behavioral: Negative for memory loss. The patient does not have insomnia.        Visit Vitals  BP 140/78 (BP 1 Location: Right arm, BP Patient Position: Sitting)   Pulse 63   Ht '5\' 2"'$  (1.575 m)   Wt 169 lb 12.8 oz (77 kg)   SpO2 97%   BMI 31.06 kg/m??     Wt Readings from Last 3 Encounters:   04/29/18 169 lb 12.8 oz (77 kg)   04/22/17 172 lb (78 kg)   07/25/16 173 lb (78.5 kg)     Physical Exam   General - well developed well nourished  Neck - JVP normal, thyroid normal  Cardiac - normal S1,S2, no rubs or gallops. No clicks. Grade 2 early peaking systolic ejection murmur  Vascular - carotids without bruits, radials, femorals and pedal pulses equal bilateral  Lungs - clear to auscultation bilaterals, no rales, wheezing or rhonchi  Abd - soft nontender, no HSM, no abd bruits  Extremities - no edema  Skin - no rash  Neuro - nonfocal  Psych - normal mood and affect    Cardiographics  EKG 05/07/14 - sinus 58, normal  EKG 03/15/15 - SB 52, normal  EKG 07/25/16- SB 50, 1st degree AV block PR 206, otherwise normal. Compared to 07/05/15, PR has increased from 193 to 206  EKG 04/29/18 - SB 54, PR 200 otherwise normal.               Written by Galen Daft, Scribekick, as dictated by Alvina Chou, M.D.     Alvina Chou, MD

## 2018-04-29 NOTE — Telephone Encounter (Signed)
Reviewed with Dr.Kapadia all refills and dose and amount given Verbal order per Dr.Kapadia

## 2018-04-29 NOTE — Progress Notes (Signed)
Progress Notes by Alvina Chou, MD at 04/29/18 1620                Author: Alvina Chou, MD  Service: --  Author Type: Physician       Filed: 05/04/18 0954  Encounter Date: 04/29/2018  Status: Signed          Editor: Alvina Chou, MD (Physician)                    Edward Frederick. Victorio Palm, MD [facility]   [address] [place] [address]      Office [phone]   Fax [phone]   Cell [phone]       [place] is a 72 y.o.  male. Last seen by me 1 year ago.      DIAGNOSES     Encounter Diagnoses              ICD-10-CM  ICD-9-CM          1.  Agatston CAC score, <100  R93.1  793.2     2.  Dyslipidemia  E78.5  272.4     3.  Sinus bradycardia  R00.1  427.89          4.  Insulin resistance  E88.81  277.7           ASSESSMENT/PLAN      Subclinical CAD with low calcium score by CT heart scan 2009. Stress nuclear study 2012 was normal. He has no sxs of angina at a good functional capacity. Resume SA 28m daily after his urology evaluation  is completed.       Sinus node dysfunction with mild PR prolongation, asx. Reviewed sxs of bradycardia and chronotropic incompetence.       Dyslipidemia/IR. Updated THL demonstrate optimized lipids but intermediate risk lipoproteins, largely unchanged from 1 year ago. Sterol markers remained normal. He has persistent low grade elevation in FBS. He follows a fairly low carb diet but will try  to do better. Continue combination therapy with Atorva 220m Zetia 1015mand MET 1500m26m Interestingly, LPa, previously normal is elevated in this set of labs. Suspect there is an error somewhere.      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 Encouraged him to resume fish oil 3 capsules daily, but advised him to freeze the capsules to minimize fishy taste. Repeat numbers in 1 year.      Enlarged prostate. Should biopsy be required, cardiac risk is low.      Advanced lipid testing demonstrates optimized lipids and lipoproteins. Other markers are fine.  Continue combination therapy.  Repeat numbers in one year.         Follow-up and Dispositions      ??  Return in about 1 year (around 04/30/2019).              HPI      Edward CUDMOREorts that he has been feeling well. He notes attending physical therapy consistently and he plays golf daily.  Continues to take  propranolol for hand tremor when plays golf. He had bladder stone extracted recently, has an enlarged prostate, will be undergoing MRI eval and biosy. His result was evaluated      Patient denies any exertional chest pain, dyspnea, palpitations, syncope, orthopnea, edema or paroxysmal nocturnal dyspnea.      Of note, [place] in [place] 9 months out of the [place].  Cardiac testing   CAC 09/27/2008 - 42.7   Exercise cardiolite 02/2011 - 12 min, normal perfusion, EF 59%   Echo 07/30/16 - EF 55-60%. No WMA. Mild LVH.         Cardiac risk factors    HTN no   DM no   Smoking no   Family hx of CAD yes - father with CABG           Past Medical History:        Diagnosis  Date         ?  CAD (coronary artery disease)            CAC 42 Dec 2009         ?  DJD (degenerative joint disease)            L4-L5, tx with PT and nerve blocks         ?  GERD (gastroesophageal reflux disease)       ?  Hyperlipidemia       ?  IGT (impaired glucose tolerance)       ?  Mixed hyperlipidemia           ?  Sinus bradycardia               Current Outpatient Medications          Medication  Sig  Dispense  Refill           ?  propranolol (INDERAL) 80 mg tablet  Take 1 Tab by mouth as needed (as needed).  90 Tab  3     ?  tamsulosin (FLOMAX) 0.4 mg capsule  Take 0.4 mg by mouth daily.         ?  cholecalciferol (VITAMIN D3) 1,000 unit cap  Take 4,000 Units by mouth daily.         ?  metFORMIN ER (GLUCOPHAGE XR) 500 mg tablet  Take 3 Tabs by mouth daily (with dinner).  270 Tab  3     ?  atorvastatin (LIPITOR) 40 mg tablet  Take 0.5 Tabs by mouth daily.  90 Tab  3           ?  ZETIA 10 mg tablet  Take 1 Tab by mouth daily. May  dispense Brand or generic prescription.  90 Tab  3           ?  raNITIdine (ZANTAC) 150 mg tablet  Take 1 Tab by mouth as needed (as needed).  90 Tab  3     ?  DOCOSAHEXANOIC ACID/EPA (FISH OIL PO)  Take  by mouth three (3) times daily. Mornings               ?  aspirin delayed-release 81 mg tablet  Take  by mouth daily.              Allergies        Allergen  Reactions         ?  Other Plant, Higher education careers adviser, Environmental  Anaphylaxis             Fire ants           Married. Retired - Paediatric nurse. Nonsmoker.       Review of Systems   Constitutional: Negative for fever, chills, malaise/fatigue and diaphoresis.    Respiratory: Negative for cough, hemoptysis, sputum production, shortness of breath and wheezing.     Cardiovascular: Negative for chest pain, palpitations, orthopnea,  claudication, leg swelling and PND.    Gastrointestinal: Negative for heartburn, nausea, vomiting, blood in stool and melena.    Genitourinary: Negative for dysuria and flank pain.    Musculoskeletal: Negative for joint pain and back pain.    Skin: Negative for rash.    Neurological: Negative for focal weakness, seizures, loss of consciousness, weakness and headaches.  +lightheadedness with exertion   Endo/Heme/Allergies: Does not bruise/bleed easily.    Psychiatric/Behavioral: Negative for memory loss. The patient does not have insomnia.           Visit Vitals      BP  140/78 (BP 1 Location: Right arm, BP Patient Position: Sitting)     Pulse  63     Ht  _0  (1.575 m)     Wt  169 lb 12.8 oz (77 kg)     SpO2  97%        BMI  31.06 kg/m??          Wt Readings from Last 3 Encounters:        04/29/18  169 lb 12.8 oz (77 kg)     04/22/17  172 lb (78 kg)        07/25/16  173 lb (78.5 kg)        Physical Exam    General - well developed well nourished   Neck - JVP normal, thyroid normal   Cardiac - normal S1,S2, no rubs or gallops. No clicks. Grade 2 early peaking systolic ejection murmur   Vascular - carotids without bruits, radials, femorals and  pedal pulses equal bilateral   Lungs - clear to auscultation bilaterals, no rales, wheezing or rhonchi   Abd - soft nontender, no HSM, no abd bruits   Extremities - no edema   Skin - no rash   Neuro - nonfocal   Psych - normal mood and affect      Cardiographics   EKG 05/07/14 - sinus 58, normal   EKG 03/15/15 - SB 52, normal   EKG 07/25/16- SB 50, 1st degree AV block PR 206, otherwise normal. Compared to 07/05/15, PR has increased from 193 to 206   EKG 04/29/18 - SB 54, PR 200 otherwise normal.                    Written by Galen Daft, Scribekick, as dictated by Alvina Chou, M.D.       Alvina Chou, MD

## 2018-04-29 NOTE — Progress Notes (Signed)
 Patient says he has no cardiac complaints today  Patient is needing refills on medications       Visit Vitals  BP 140/78 (BP 1 Location: Right arm, BP Patient Position: Sitting)   Pulse 63   Ht 5' 2 (1.575 m)   Wt 169 lb 12.8 oz (77 kg)   SpO2 97%   BMI 31.06 kg/m

## 2018-05-05 MED ORDER — PROPRANOLOL 80 MG TAB
80 mg | ORAL_TABLET | ORAL | 3 refills | Status: DC | PRN
Start: 2018-05-05 — End: 2018-05-09

## 2018-05-05 NOTE — Telephone Encounter (Signed)
Resent refill per Dr.Kapadia

## 2018-05-09 MED ORDER — PROPRANOLOL 40 MG TAB
40 mg | ORAL_TABLET | ORAL | 3 refills | Status: AC | PRN
Start: 2018-05-09 — End: ?

## 2018-05-09 NOTE — Telephone Encounter (Signed)
Done

## 2018-05-09 NOTE — Telephone Encounter (Signed)
Patient stated taking Inderal 40mg  not 80 mg. Would like a refill with correct dose.

## 2018-05-09 NOTE — Telephone Encounter (Addendum)
Patient states that he requested the wrong dose for propranolol. He states that he told Dr Milbert CoulterKapadia he was taking 80 mg but he is only taking 40 mg. Patient [facility].     Phone: [phone]

## 2018-07-21 ENCOUNTER — Encounter

## 2018-07-23 ENCOUNTER — Inpatient Hospital Stay: Admit: 2018-07-23 | Payer: MEDICARE | Attending: Orthopaedic Surgery

## 2018-07-23 DIAGNOSIS — M67431 Ganglion, right wrist: Secondary | ICD-10-CM

## 2018-07-24 ENCOUNTER — Ambulatory Visit: Payer: MEDICARE

## 2019-02-16 IMAGING — DX FOOT 3 VIEWS LEFT
1 series · 3 of 3 positions shown · non-contrast
Comparison: none

FOOT 3 VIEWS LEFT, 02/16/2019 [DATE]: 
CLINICAL INDICATION:  Fourth toe pain for 2 weeks, no trauma 
COMPARISON EXAMINATIONS: None

[Series 1: AP · U · 0.14mm/px · 3 of 3 slices shown]
[im 1/3]
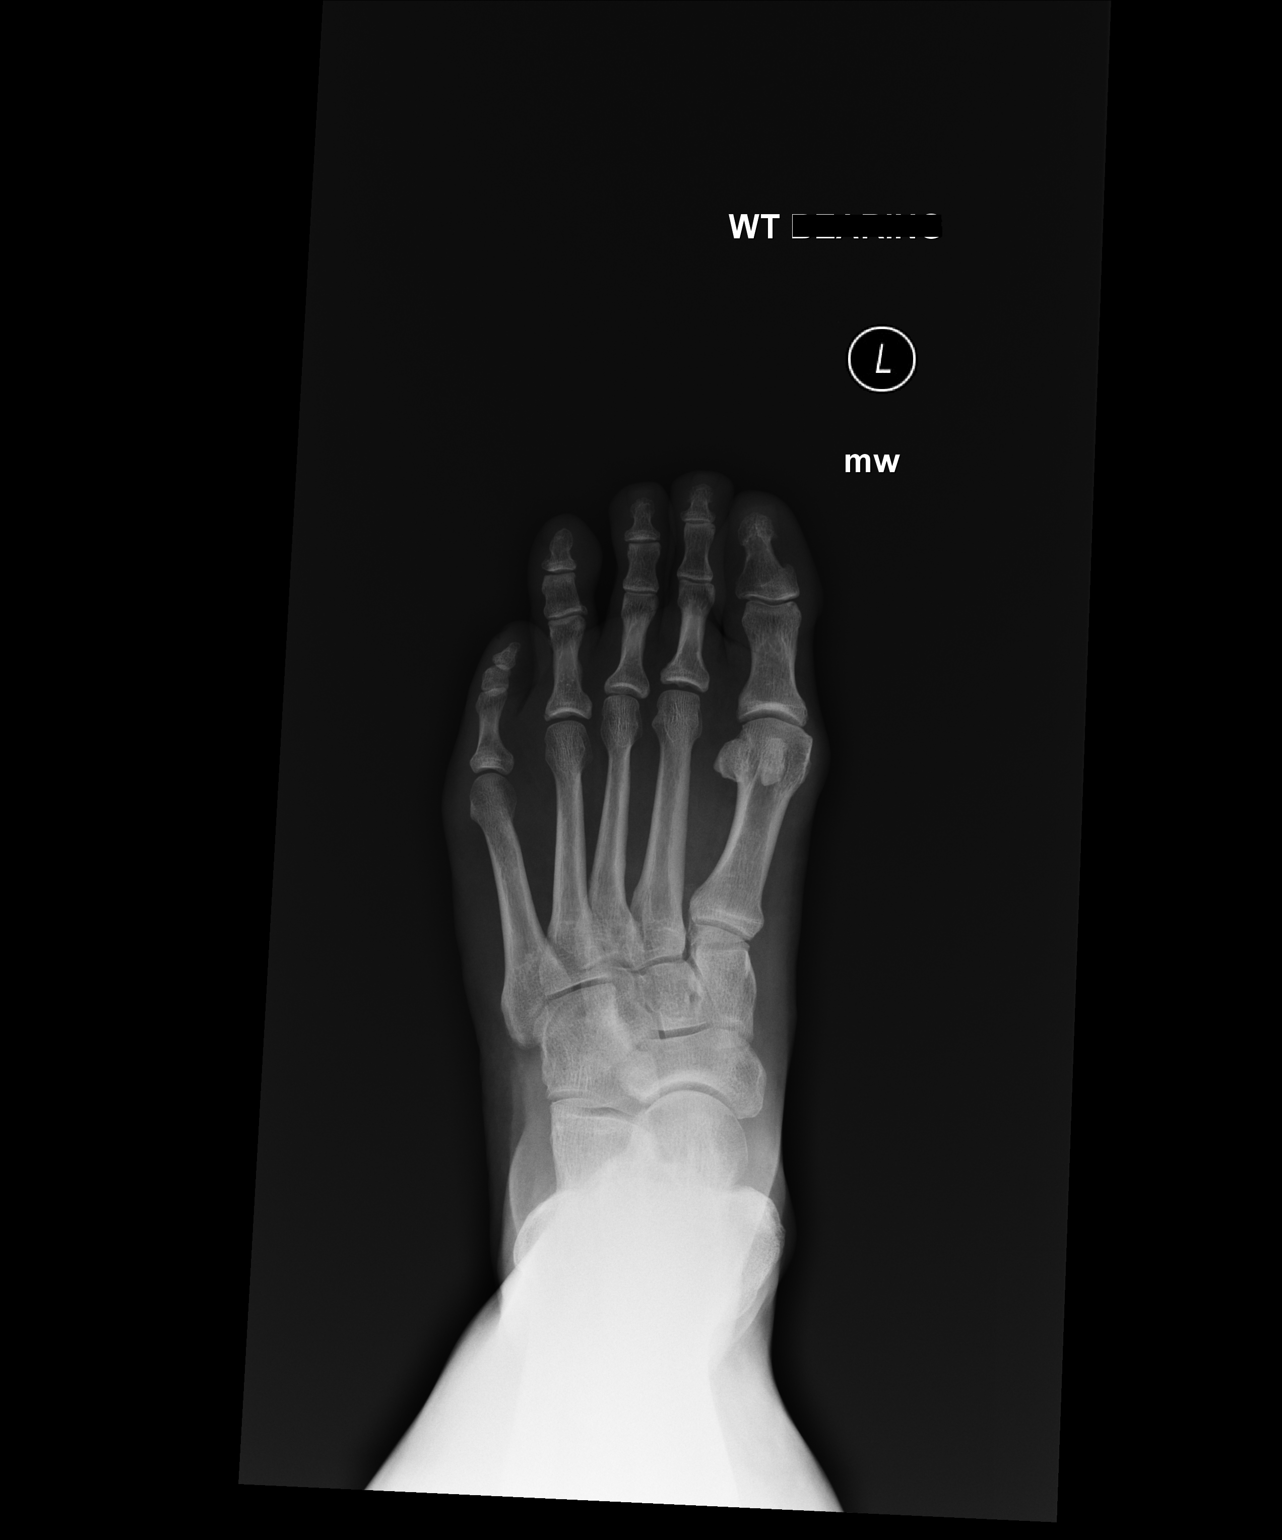
[im 2/3]
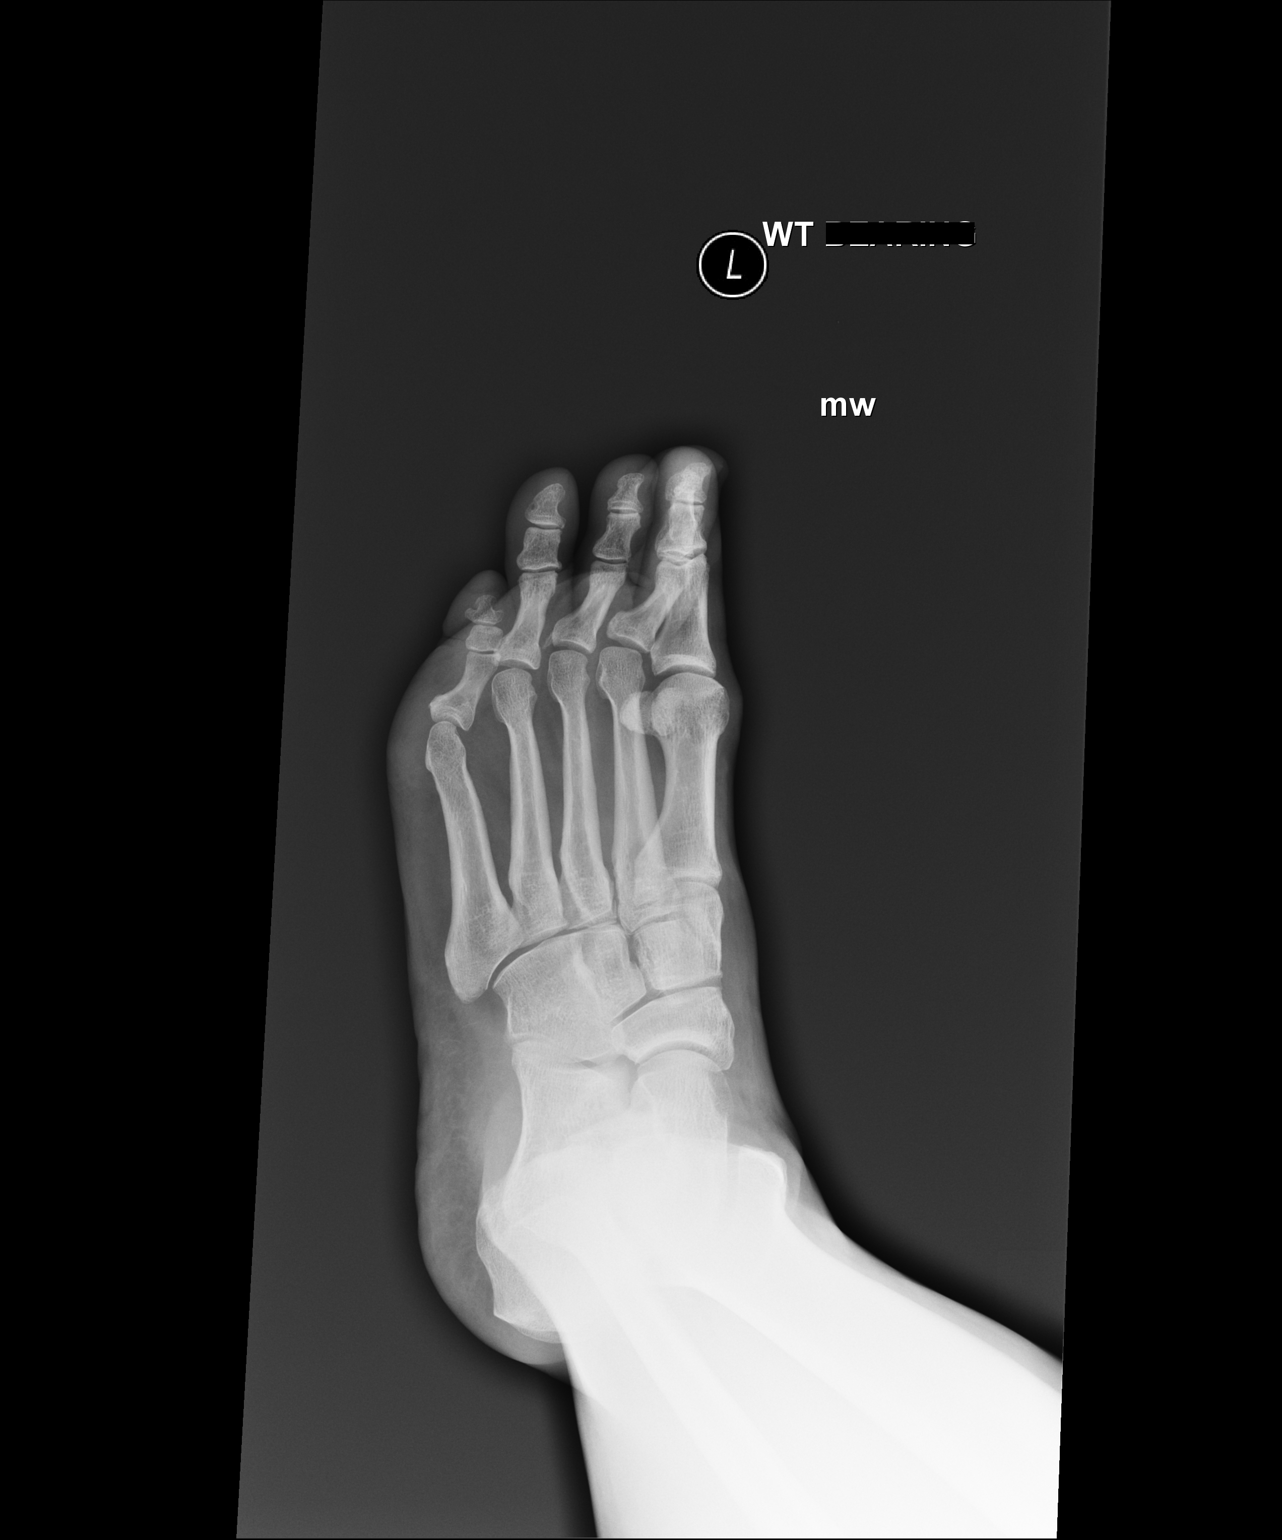
[im 3/3]
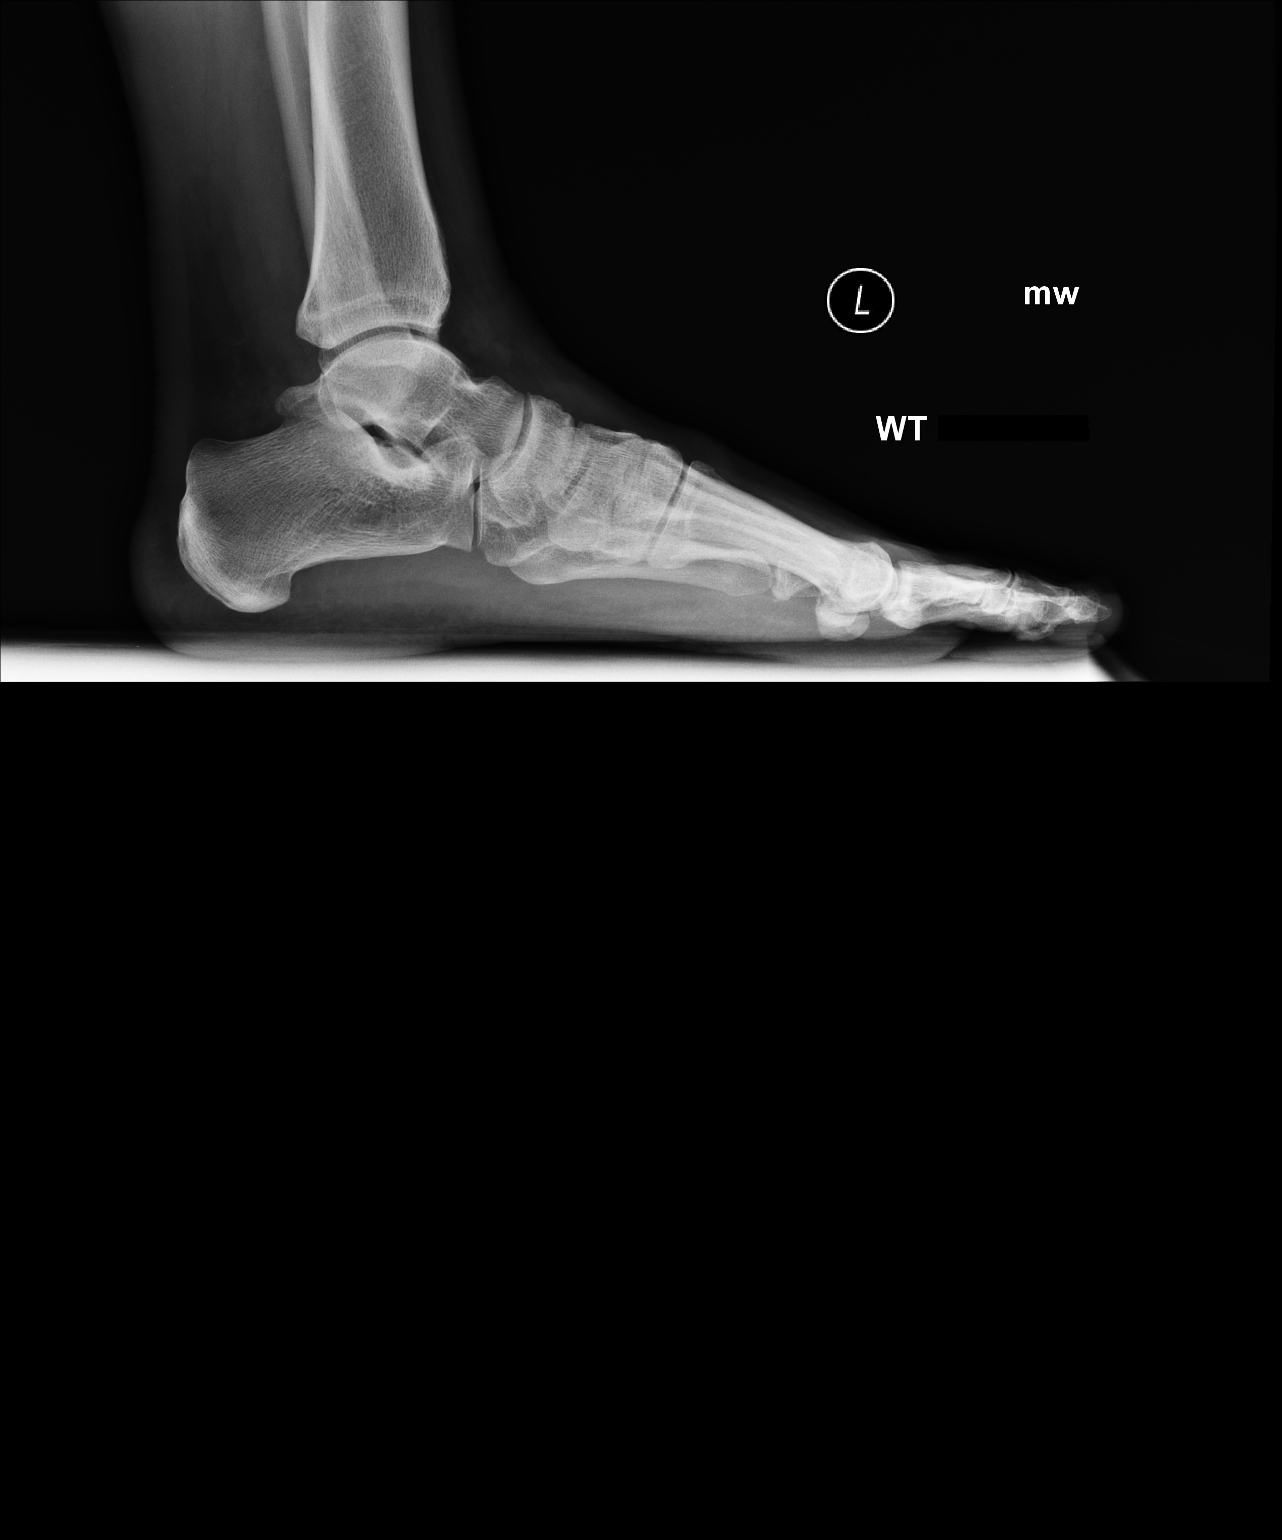

[3 of 3 positions shown; findings below may reference images not displayed]

FINDINGS: No acute fracture or dislocation. Lisfranc alignment is maintained. 
Mild degenerative change of the interphalangeal joints and of the first MTP 
joint. Mild degenerative changes of the hindfoot. Soft tissues normal.
IMPRESSION: Degenerative changes without acute fracture or dislocation.

## 2019-03-30 MED ORDER — ATORVASTATIN 40 MG TAB
40 mg | ORAL_TABLET | Freq: Every day | ORAL | 0 refills | Status: DC
Start: 2019-03-30 — End: 2019-04-02

## 2019-03-30 NOTE — Telephone Encounter (Signed)
NOV 05/06/19  Requested Prescriptions     Signed Prescriptions Disp Refills   ??? atorvastatin (LIPITOR) 40 mg tablet 30 Tab 0     Sig: Take 0.5 Tabs by mouth daily.     Authorizing Provider: Doreene Adas     Ordering User: Margarette Canada

## 2019-04-02 MED ORDER — ATORVASTATIN 20 MG TAB
20 mg | ORAL_TABLET | Freq: Every day | ORAL | 0 refills | Status: DC
Start: 2019-04-02 — End: 2019-05-06

## 2019-04-02 NOTE — Telephone Encounter (Signed)
Patient is calling as he was called in a new prescription for his atorvastatin but that it was 40 mg. Patient would like to know if a new prescription can be called in for 20 mg tablets, 90 day supply as well as three refills.. Please advise.     Pharmacy confirmed     Phone: [phone]

## 2019-04-02 NOTE — Telephone Encounter (Signed)
PTIDX2 notified patient can only fill until upcoming appt. Patient verbalized understanding and will have labs done  Requested Prescriptions     Pending Prescriptions Disp Refills   ??? atorvastatin (LIPITOR) 20 mg tablet 90 Tab 0     Sig: Take 1 Tab by mouth daily.     VO dr.Kapadia

## 2019-04-13 NOTE — Telephone Encounter (Signed)
Patient is requesting to speak with [facility] regarding his lab work. He states that he would like to discuss options for where he can have it drawn. Please advise.     Phone: [phone]

## 2019-04-13 NOTE — Telephone Encounter (Signed)
PTIDX2 stated will go to [address]

## 2019-04-22 NOTE — Telephone Encounter (Signed)
Notified that [name]'s Metformin ER batch had been recalled.      He informed me that he had previously filled a 90 day supply while he was down in [place]. This batch was from a different pharmaceutical company. He has disposed of the recalled batch and is taking the other manufacturers supply.      He does not need any additional refills at this time.

## 2019-05-06 ENCOUNTER — Encounter

## 2019-05-06 ENCOUNTER — Ambulatory Visit: Admit: 2019-05-06 | Discharge: 2019-05-06 | Payer: MEDICARE | Attending: Specialist

## 2019-05-06 ENCOUNTER — Ambulatory Visit: Attending: Specialist

## 2019-05-06 DIAGNOSIS — R001 Bradycardia, unspecified: Secondary | ICD-10-CM

## 2019-05-06 MED ORDER — POTASSIUM CITRATE SR 10 MEQ (1,080 MG) TAB
10 mEq (1,080 mg) | ORAL_TABLET | Freq: Every day | ORAL | 3 refills | Status: AC
Start: 2019-05-06 — End: ?

## 2019-05-06 MED ORDER — ZETIA 10 MG TABLET
10 mg | ORAL_TABLET | Freq: Every day | ORAL | 3 refills | Status: DC
Start: 2019-05-06 — End: 2019-06-16

## 2019-05-06 MED ORDER — OMEPRAZOLE 20 MG CAP, DELAYED RELEASE
20 mg | ORAL_CAPSULE | ORAL | 3 refills | Status: AC
Start: 2019-05-06 — End: ?

## 2019-05-06 MED ORDER — METFORMIN SR 500 MG 24 HR TABLET
500 mg | ORAL_TABLET | Freq: Every day | ORAL | 3 refills | Status: AC
Start: 2019-05-06 — End: ?

## 2019-05-06 MED ORDER — ATORVASTATIN 20 MG TAB
20 mg | ORAL_TABLET | Freq: Every day | ORAL | 3 refills | Status: AC
Start: 2019-05-06 — End: ?

## 2019-05-06 NOTE — Progress Notes (Signed)
Edward Munce J. Victorio Palm, MD [facility]  [address] [place] [address]    Office [phone]  Fax [phone]  Cell [phone]     [place] is a 73 y.o. male. Last seen by me 1 year ago.    DIAGNOSES  Encounter Diagnoses     ICD-10-CM ICD-9-CM   1. Sinus bradycardia  R00.1 427.89   2. Dyslipidemia  E78.5 272.4   3. Agatston CAC score, <100  R93.1 793.2   4. Insulin resistance  E88.81 277.7       ASSESSMENT/PLAN    Subclinical CAD with low calcium score by CT heart scan 2009 (43). Stress nuclear study 2012 was normal. He has no sxs of angina at a good functional capacity.   - Continue ASA 71m daily    Sinus node dysfunction, axs. Reviewed sxs of bradycardia and chronotropic incompetence.     Dyslipidemia/Prediabetes. [facility]/  LDL 57, ApoB 69, FBS 100, A1c 5.7%. He has gained some weight during the pandemic but will try to do better. Interesting, ice cream gives him reflux.  - Continue combination therapy with Atorva 211m Zetia 1074mand MET 1500m52m  - Repeat labs w/ [facility] in 1 year     He may completely relocate to [place] the next year. If so, I am happy to do a VV next year,       Follow-up and Dispositions    ?? Return in about 1 year (around 05/05/2020).        HPI    JohnEDGERRIN CORREIAorts palpitations (pounding sensation) around winter time. He reports some mild-moderate DOE going up the stairs. Patient denies any exertional chest pain syncope, orthopnea, edema or paroxysmal nocturnal dyspnea.    He reports previously remaining active by walking daily for 2-3 miles but has recently been sedentary d/t the weather and caring for his sick dog.     He takes propanolol as needed for tremors.     Hx of small natal hernia and right kidney stone which was removed Nov. 2019 with cystectomy.     Of note, [place] 9 months out of the year. His dog was just diagnosed w/ cancer.      Cardiac testing  CAC 09/27/2008 - 42.7  Exercise cardiolite 02/2011 - 12 min, normal perfusion, EF 59%  Echo 07/30/16 - EF 55-60%. No WMA. Mild LVH.      Cardiac risk factors   HTN no  DM no  Smoking no  Family hx of CAD yes - father with CABG      Past Medical History:   Diagnosis Date   ??? CAD (coronary artery disease)     CAC 42 Dec 2009   ??? DJD (degenerative joint disease)     L4-L5, tx with PT and nerve blocks   ??? GERD (gastroesophageal reflux disease)    ??? Hyperlipidemia    ??? IGT (impaired glucose tolerance)    ??? Mixed hyperlipidemia    ??? Sinus bradycardia        Current Outpatient Medications   Medication Sig Dispense Refill   ??? meloxicam (MOBIC) 15 mg tablet every other day.     ??? propranolol (INDERAL) 40 mg tablet Take 1 Tab by mouth as needed (as needed). 90 Tab 3   ??? tamsulosin (FLOMAX) 0.4 mg capsule Take 0.4 mg by mouth daily.     ???  cholecalciferol (VITAMIN D3) 1,000 unit cap Take 4,000 Units by mouth every other day.     ??? aspirin delayed-release 81 mg tablet Take  by mouth daily.     ??? Zetia 10 mg tablet Take 1 Tab by mouth daily. May dispense Brand or generic prescription. 90 Tab 3   ??? metFORMIN ER (GLUCOPHAGE XR) 500 mg tablet Take 3 Tabs by mouth daily (with dinner). 270 Tab 3   ??? atorvastatin (LIPITOR) 20 mg tablet Take 1 Tab by mouth daily. 90 Tab 3   ??? potassium citrate (UROCIT-K10) 10 mEq (1,080 mg) TbER Take 1 Tab by mouth daily. 90 Tab 3   ??? omeprazole (PRILOSEC) 20 mg capsule TAKE 1 CAPSULE BY MOUTH IN THE EVENING 90 Cap 3   ??? DOCOSAHEXANOIC ACID/EPA (FISH OIL PO) Take  by mouth three (3) times daily. Mornings       Allergies   Allergen Reactions   ??? Other Plant, Higher education careers adviser, Environmental Anaphylaxis     Fire ants       Married. Retired - Paediatric nurse. Nonsmoker.     Review of Systems  Constitutional: Negative for fever, chills, malaise/fatigue and diaphoresis.   Respiratory: Negative for cough, hemoptysis, sputum production and wheezing. +DOE     Cardiovascular: Negative for chest pain, orthopnea, claudication, leg swelling and PND. +palpitations  Gastrointestinal: Negative for heartburn, nausea, vomiting, blood in stool and melena.   Genitourinary: Negative for dysuria and flank pain.   Musculoskeletal: Negative for joint pain and back pain.   Skin: Negative for rash.   Neurological: Negative for focal weakness, seizures, loss of consciousness, weakness and headaches.   Endo/Heme/Allergies: Does not bruise/bleed easily.   Psychiatric/Behavioral: Negative for memory loss. The patient does not have insomnia.        Visit Vitals  BP 124/62 (BP 1 Location: Left arm, BP Patient Position: Sitting)   Pulse 64   Resp 18   Ht 5' 2"  (1.575 m)   Wt 177 lb (80.3 kg)   SpO2 96%   BMI 32.37 kg/m??     Wt Readings from Last 3 Encounters:   05/06/19 177 lb (80.3 kg)   04/29/18 169 lb 12.8 oz (77 kg)   04/22/17 172 lb (78 kg)     Physical Exam   General - well developed well nourished  Neck - JVP normal, thyroid normal  Cardiac - normal S1,S2, no rubs or gallops. No clicks. Grade 2 early peaking systolic ejection murmur  Vascular - carotids without bruits, radials, femorals and pedal pulses equal bilateral  Lungs - clear to auscultation bilaterals, no rales, wheezing or rhonchi  Abd - soft nontender, no HSM, no abd bruits  Extremities - no edema  Skin - no rash  Neuro - nonfocal  Psych - normal mood and affect    Cardiographics  EKG 05/07/14 - sinus 58, normal  EKG 03/15/15 - SB 52, normal  EKG 07/25/16- SB 50, 1st degree AV block PR 206, otherwise normal. Compared to 07/05/15, PR has increased from 193 to 206  EKG 04/29/18 - SB 54, PR 200 otherwise normal.   EKG 05/06/19- SR 59, normal             Written by Alain Honey, Scribekick, as dictated by Alvina Chou, M.D.     Alvina Chou, MD

## 2019-05-06 NOTE — Progress Notes (Signed)
Edward Villarreal is a 72 y.o. male    Chief Complaint   Patient presents with   ??? Annual Exam   ??? Cholesterol Problem   ??? Slow Heart Rate     Pt states he has had some palpitations more often this year.    Chest pain : no  SOB : no  Dizziness : no  Edema : no  Refills : no    Visit Vitals  BP 124/62 (BP 1 Location: Left arm, BP Patient Position: Sitting)   Pulse 64   Resp 18   Ht 5' 2" (1.575 m)   Wt 177 lb (80.3 kg)   SpO2 96%   BMI 32.37 kg/m??       1. Have you been to the ER, urgent care clinic since your last visit?  Hospitalized since your last visit? YES, in [place] for kidney stone on 08/31/2018 [facility].    2. Have you seen or consulted any other health care providers outside of the [facility] since your last visit?  Include any pap smears or colon screening. [name]

## 2019-05-06 NOTE — Progress Notes (Signed)
Progress Notes by Alvina Chou, MD at 05/06/19 4696                Author: Alvina Chou, MD  Service: --  Author Type: Physician       Filed: 05/07/19 0902  Encounter Date: 05/06/2019  Status: Signed          Editor: Alvina Chou, MD (Physician)                    Edward Frederick. Victorio Palm, MD [facility]   [address] [place] [address]      Office [phone]   Fax [phone]   Cell [phone]       Edward Villarreal is a 73 y.o.  male. Last seen by me 1 year ago.      DIAGNOSES     Encounter Diagnoses              ICD-10-CM  ICD-9-CM          1.  Sinus bradycardia   R00.1  427.89     2.  Dyslipidemia   E78.5  272.4     3.  Agatston CAC score, <100   R93.1  793.2          4.  Insulin resistance   E88.81  277.7           ASSESSMENT/PLAN      Subclinical CAD with low calcium score by CT heart scan 2009 (43). Stress nuclear study 2012 was normal. He has no sxs of angina at a good functional capacity.    - Continue ASA 63m daily      Sinus node dysfunction, axs. Reviewed sxs of bradycardia and chronotropic incompetence.       Dyslipidemia/Prediabetes. [facility]/   LDL 57, ApoB 69, FBS 100, A1c 5.7%. He has gained some weight during the pandemic but will try to do better. Interesting, ice cream gives him reflux.   - Continue combination therapy with Atorva 282m Zetia 1046mand MET 1500m27m   - Repeat labs w/ [facility] in 1 year       He may completely relocate to [place] the next year. If so, I am happy to do a VV next year,            Follow-up and Dispositions      ??  Return in about 1 year (around 05/05/2020).              HPI      Edward Villarreal palpitations (pounding sensation) around winter time. He reports some mild-moderate DOE going up the stairs. Patient denies  any exertional chest pain syncope, orthopnea, edema or paroxysmal nocturnal dyspnea.      He reports previously remaining  active by walking daily for 2-3 miles but has recently been sedentary d/t the weather and caring for his sick dog.       He takes propanolol as needed for tremors.       Hx of small natal hernia and right kidney stone which was removed Nov. 2019 with cystectomy.       Of note, [place] lives in [place] 9 months out of the year. His dog was just diagnosed w/ cancer.       Cardiac testing   CAC 09/27/2008 - 42.7   Exercise cardiolite 02/2011 - 12 min, normal  perfusion, EF 59%   Echo 07/30/16 - EF 55-60%. No WMA. Mild LVH.         Cardiac risk factors    HTN no   DM no   Smoking no   Family hx of CAD yes - father with CABG           Past Medical History:        Diagnosis  Date         ?  CAD (coronary artery disease)            CAC 42 Dec 2009         ?  DJD (degenerative joint disease)            L4-L5, tx with PT and nerve blocks         ?  GERD (gastroesophageal reflux disease)       ?  Hyperlipidemia       ?  IGT (impaired glucose tolerance)       ?  Mixed hyperlipidemia           ?  Sinus bradycardia               Current Outpatient Medications          Medication  Sig  Dispense  Refill           ?  meloxicam (MOBIC) 15 mg tablet  every other day.         ?  propranolol (INDERAL) 40 mg tablet  Take 1 Tab by mouth as needed (as needed).  90 Tab  3     ?  tamsulosin (FLOMAX) 0.4 mg capsule  Take 0.4 mg by mouth daily.         ?  cholecalciferol (VITAMIN D3) 1,000 unit cap  Take 4,000 Units by mouth every other day.         ?  aspirin delayed-release 81 mg tablet  Take  by mouth daily.         ?  Zetia 10 mg tablet  Take 1 Tab by mouth daily. May dispense Brand or generic prescription.  90 Tab  3     ?  metFORMIN ER (GLUCOPHAGE XR) 500 mg tablet  Take 3 Tabs by mouth daily (with dinner).  270 Tab  3           ?  atorvastatin (LIPITOR) 20 mg tablet  Take 1 Tab by mouth daily.  90 Tab  3           ?  potassium citrate (UROCIT-K10) 10 mEq (1,080 mg) TbER  Take 1 Tab by mouth daily.  90 Tab  3     ?  omeprazole  (PRILOSEC) 20 mg capsule  TAKE 1 CAPSULE BY MOUTH IN THE EVENING  90 Cap  3           ?  DOCOSAHEXANOIC ACID/EPA (FISH OIL PO)  Take  by mouth three (3) times daily. Mornings              Allergies        Allergen  Reactions         ?  Other Plant, Higher education careers adviser, Environmental  Anaphylaxis             Fire ants           Married. Retired - Paediatric nurse. Nonsmoker.       Review of Systems   Constitutional: Negative for fever, chills,  malaise/fatigue and diaphoresis.    Respiratory: Negative for cough, hemoptysis, sputum production and wheezing. +DOE     Cardiovascular: Negative for chest pain, orthopnea, claudication, leg swelling and PND. +palpitations   Gastrointestinal: Negative for heartburn, nausea, vomiting, blood in stool and melena.    Genitourinary: Negative for dysuria and flank pain.    Musculoskeletal: Negative for joint pain and back pain.    Skin: Negative for rash.    Neurological: Negative for focal weakness, seizures, loss of consciousness, weakness and headaches.    Endo/Heme/Allergies: Does not bruise/bleed easily.    Psychiatric/Behavioral: Negative for memory loss. The patient does not have insomnia.           Visit Vitals      BP  124/62 (BP 1 Location: Left arm, BP Patient Position: Sitting)     Pulse  64     Resp  18     Ht  '5\' 2"'  (1.575 m)     Wt  177 lb (80.3 kg)     SpO2  96%        BMI  32.37 kg/m??          Wt Readings from Last 3 Encounters:        05/06/19  177 lb (80.3 kg)     04/29/18  169 lb 12.8 oz (77 kg)        04/22/17  172 lb (78 kg)        Physical Exam    General - well developed well nourished   Neck - JVP normal, thyroid normal   Cardiac - normal S1,S2, no rubs or gallops. No clicks. Grade 2 early peaking systolic ejection murmur   Vascular - carotids without bruits, radials, femorals and pedal pulses equal bilateral   Lungs - clear to auscultation bilaterals, no rales, wheezing or rhonchi   Abd - soft nontender, no HSM, no abd bruits   Extremities - no edema   Skin - no rash    Neuro - nonfocal   Psych - normal mood and affect      Cardiographics   EKG 05/07/14 - sinus 58, normal   EKG 03/15/15 - SB 52, normal   EKG 07/25/16- SB 50, 1st degree AV block PR 206, otherwise normal. Compared to 07/05/15, PR has increased from 193 to 206   EKG 04/29/18 - SB 54, PR 200 otherwise normal.    EKG 05/06/19- SR 59, normal                 Written by Edward Villarreal, [employer], as dictated by Alvina Chou, M.D.       Alvina Chou, MD

## 2019-05-06 NOTE — Progress Notes (Signed)
 Edward Villarreal is a 73 y.o. male    Chief Complaint   Patient presents with   . Annual Exam   . Cholesterol Problem   . Slow Heart Rate     Pt states he has had some palpitations more often this year.    Chest pain : no  SOB : no  Dizziness : no  Edema : no  Refills : no    Visit Vitals  BP 124/62 (BP 1 Location: Left arm, BP Patient Position: Sitting)   Pulse 64   Resp 18   Ht 5' 2 (1.575 m)   Wt 177 lb (80.3 kg)   SpO2 96%   BMI 32.37 kg/m       1. Have you been to the ER, urgent care clinic since your last visit?  Hospitalized since your last visit? YES, in [facility] for kidney stone on 08/31/2018 [facility].    2. Have you seen or consulted any other health care providers outside of the [facility] since your last visit?  Include any pap smears or colon screening. [name]

## 2019-06-16 ENCOUNTER — Encounter

## 2019-06-16 MED ORDER — ZETIA 10 MG TABLET
10 mg | ORAL_TABLET | Freq: Every day | ORAL | 3 refills | Status: AC
Start: 2019-06-16 — End: ?

## 2019-06-16 NOTE — Telephone Encounter (Signed)
Requested Prescriptions     Pending Prescriptions Disp Refills   ??? Zetia 10 mg tablet 90 Tab 3     Sig: Take 1 Tab by mouth daily. May dispense Brand or generic prescription.     - patient would like call back from nurse before refilling.

## 2019-07-08 NOTE — Telephone Encounter (Signed)
Pt states he needs to speak with the nurse or doctor b/c it's an urgent matter. Pt wants Dr. Milbert Coulter to sign off on the fax that was sent from [facility] today for his procedure b/c he can't wait. THE SCHEDULER WILL NOT TAKE A VERBAL CONSENT HE NEEDS DR. KAPADIA TO SIGN THE FORM. Please advise.   [phone] or [phone]        Phone:[phone]

## 2019-07-08 NOTE — Telephone Encounter (Signed)
PTIDX2 notified patient recommendation to hold ASA for procedure

## 2019-07-08 NOTE — Telephone Encounter (Signed)
[facility] requesting cardiac stratification for Prostate biopsy with ultrasound under general anesthesia    advise

## 2019-07-08 NOTE — Telephone Encounter (Signed)
[facility] requesting recommendation to hold ASA for biopsy procedure.    Advise

## 2019-07-08 NOTE — Telephone Encounter (Signed)
Patient would like a call from [facility] ASAP, he states that this is important. No further details were given.     Phone;[phone]

## 2019-07-08 NOTE — Telephone Encounter (Signed)
Angela from [facility] is faxing over a clearance for the patient to stop his aspirin prior to a biopsy procedure. Marylene Land just wanted to let you know so that you can be on the lookout for it. Marylene Land states that they would like expedite this patients biopsy. Please call and let her know that this clearance was received. Thanks    Phone: [phone]

## 2019-07-09 NOTE — Telephone Encounter (Signed)
Marylene Land with [facility] is calling to follow up on cardiac clearance request for a surgery date being held for next week.   She states that if she does not answer her direct line to please leave a message stating whether or not the patient is cleared and to confirm the fax is sent. Thanks.     Fax: [phone]   Phone: [phone] ext [phone]

## 2019-07-09 NOTE — Telephone Encounter (Signed)
Low cardiac risk for prostate bx under [place]. Can stop aspirin one week preop

## 2019-07-10 NOTE — Telephone Encounter (Signed)
Faxed to [facility]

## 2020-05-09 ENCOUNTER — Encounter: Attending: Specialist

## 2022-09-30 IMAGING — MR MRI CERVICAL SPINE WITHOUT CONTRAST
5 of 7 series · 23 of 48 positions shown · IV contrast (gadolinium)
Comparison: None

________________________________________________________________________________________________ 
MRI CERVICAL SPINE WITHOUT CONTRAST, 09/30/2022 [DATE]: 
CLINICAL INDICATION: Cervical root disorder, patient describes neck and right 
arm pain
TECHNIQUE: Sagittal T1, Sagittal T2, Sagittal STIR, Axial TSE and Axial MQQQZ 
images of the cervical spine were performed without intravenous gadolinium 
enhancement.

[Series 201: survey · axial · 10.0mm · 1.25mm/px · z∈[-211,-35]mm · 3 of 10 slices shown]
[im 1/10]
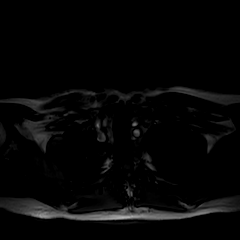
[im 5/10]
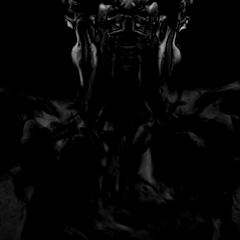
[im 10/10]
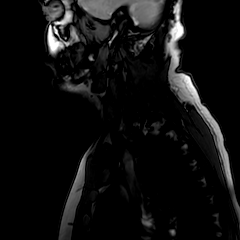

[Series 301: t2w_cor-surv · coronal · 5.0mm · 0.69mm/px · 3 of 7 slices shown]
[im 1/7]
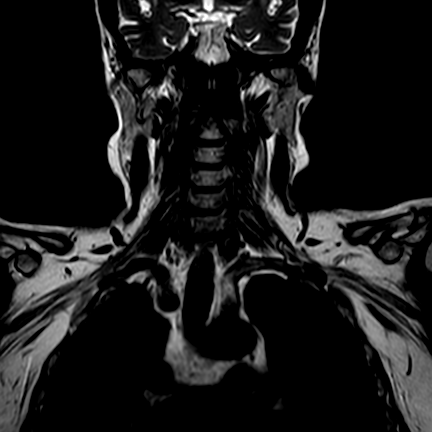
[im 4/7]
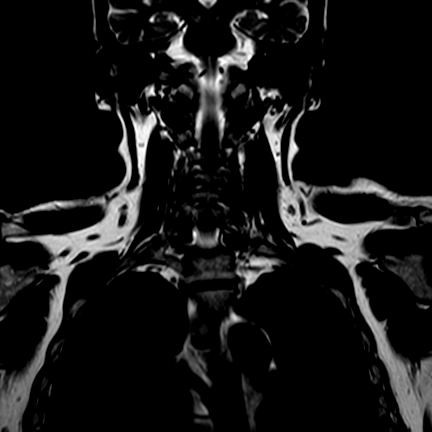
[im 7/7]
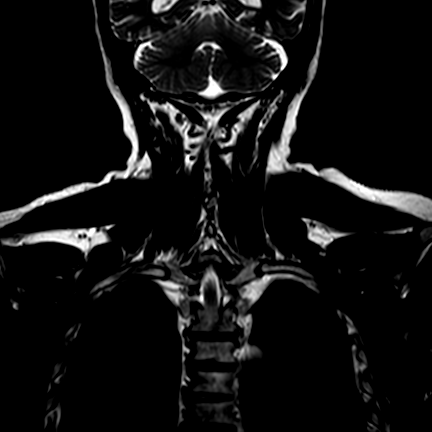

[Series 401: t1_sag · sagittal · 3.0mm · 0.39mm/px · 6 of 15 slices shown]
[im 1/15]
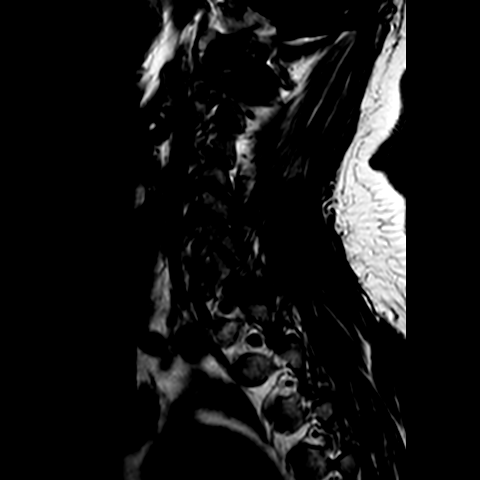
[im 3/15]
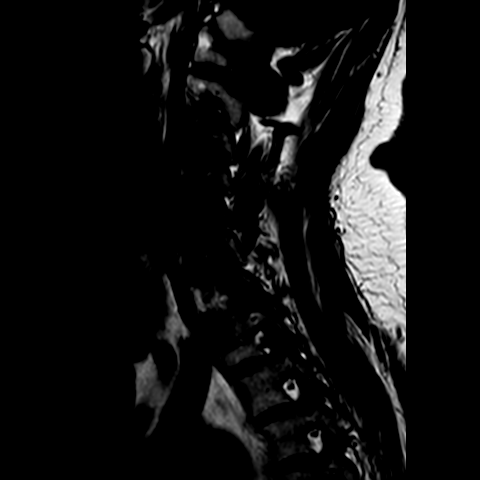
[im 6/15]
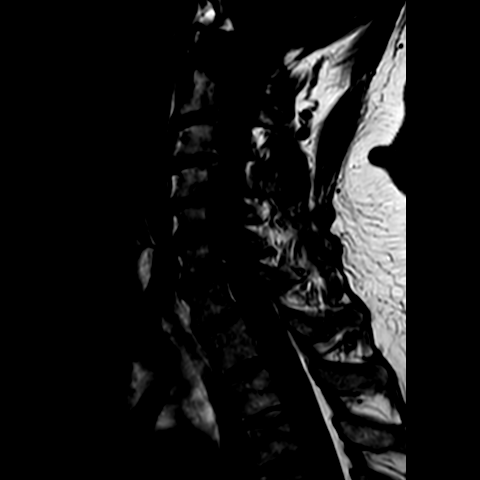
[im 9/15]
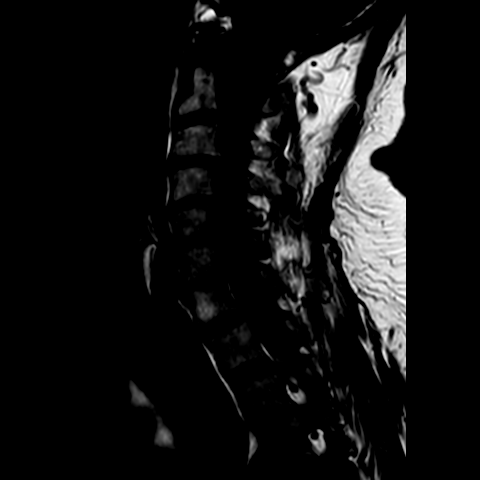
[im 12/15]
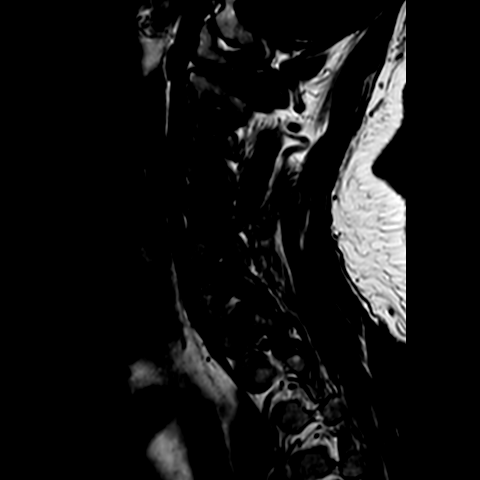
[im 15/15]
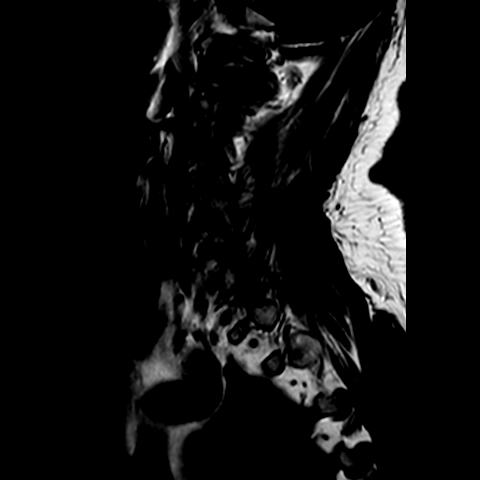

[Series 502: (id)_mdixon_tse · sagittal · 3.0mm · 0.42mm/px · 2 of 15 slices shown]
[im 1/15]
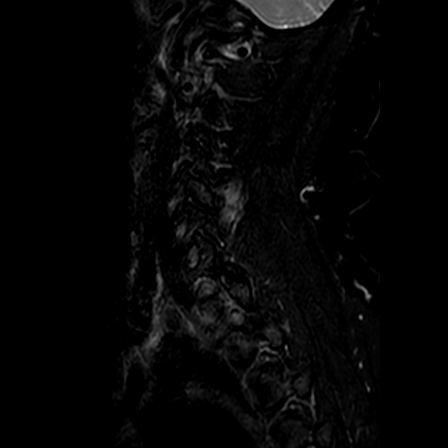
[im 3/15]
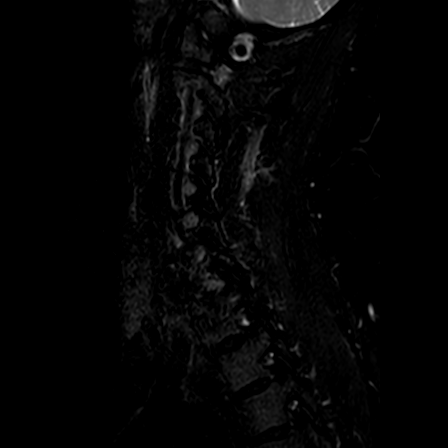

[Series 701: T2 · axial · 3.0mm · 0.31mm/px · z∈[-221,-124]mm · 9 of 32 slices shown]
[im 1/32]
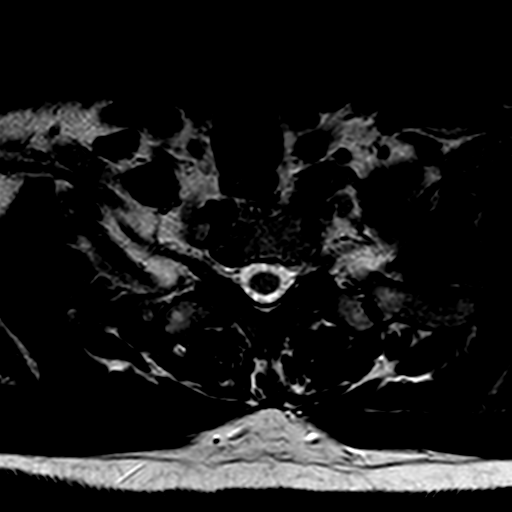
[im 6/32]
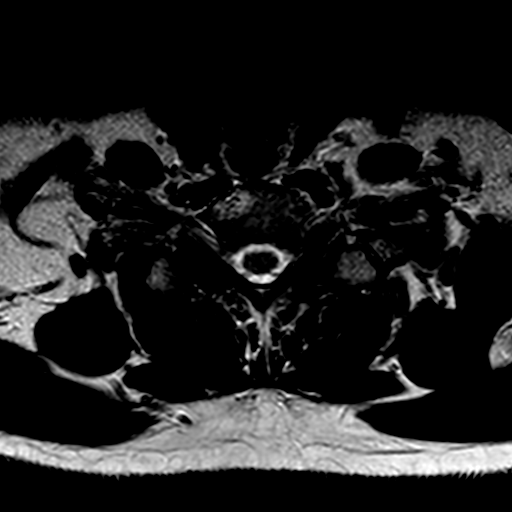
[im 9/32]
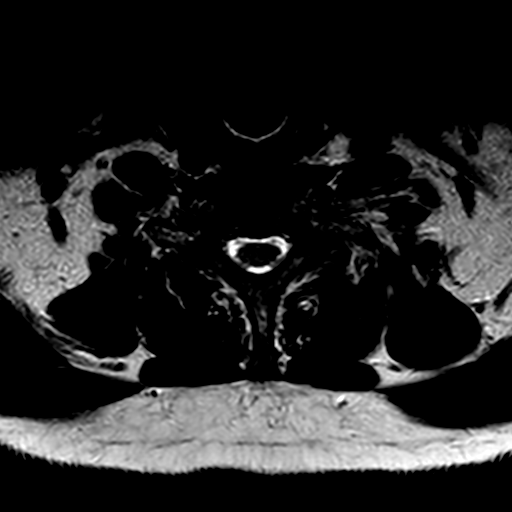
[im 15/32]
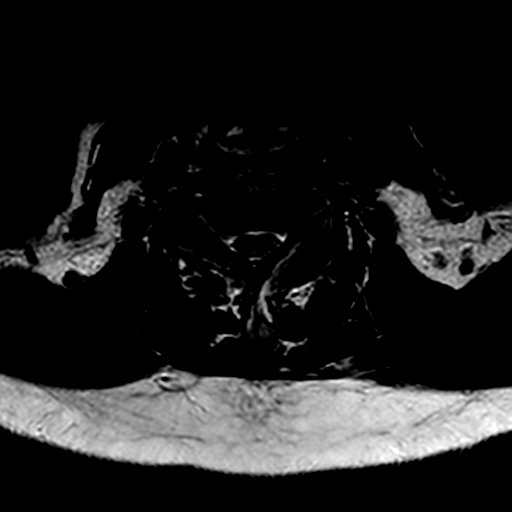
[im 17/32]
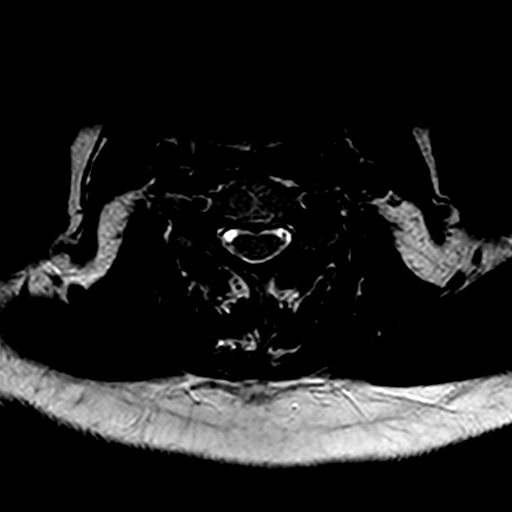
[im 23/32]
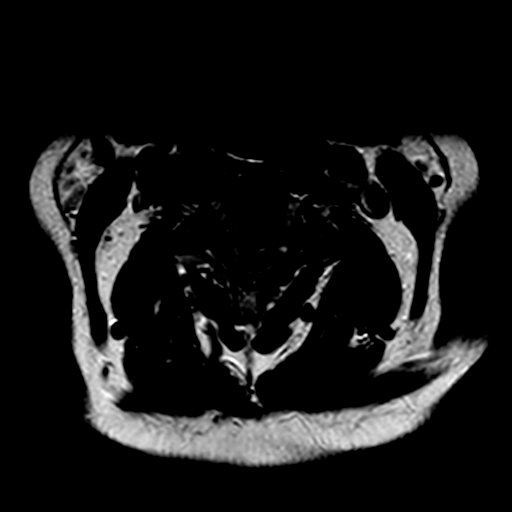
[im 26/32]
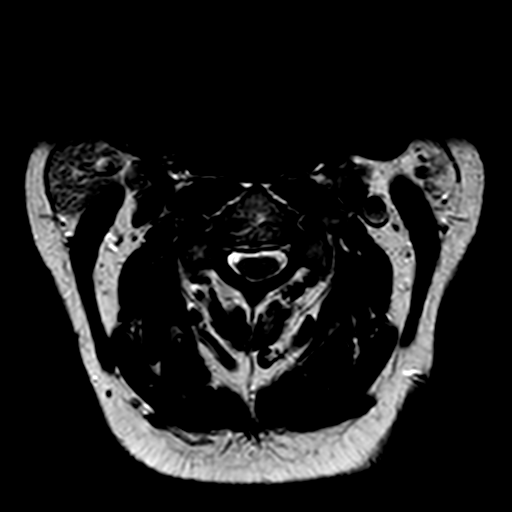
[im 29/32]
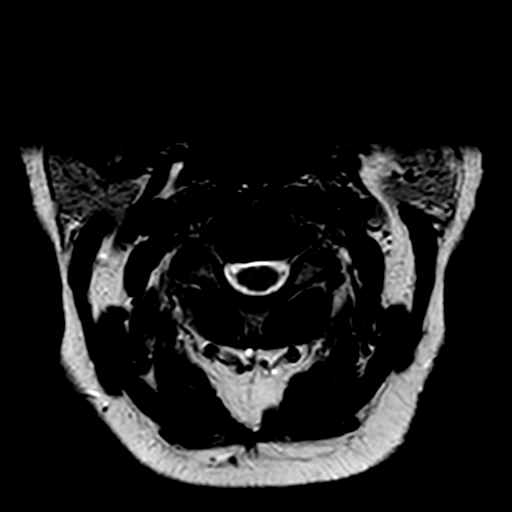
[im 32/32]
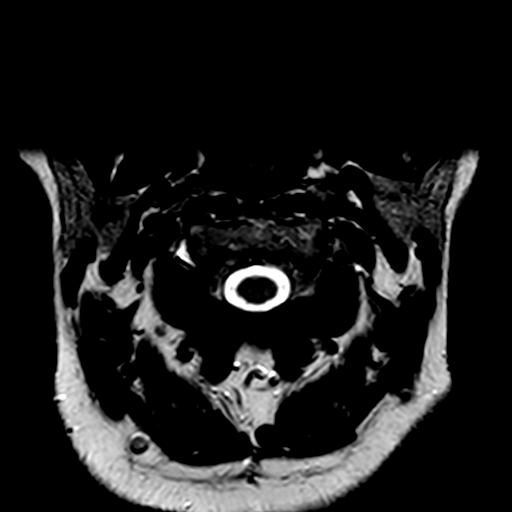

[23 of 48 positions shown; findings below may reference images not displayed]

FINDINGS: Cervical vertebral heights are intact. There is spondylosis, with 
marked disc narrowing from C3 through C7. Lordosis is preserved. There is 1 mm 
retrolisthesis at C4-5 and C6-7. 
Cord signal appears normal. The dens is intact. The craniocervical junction is 
there is mild cervical dextrocurvature. 
No evidence for malignancy. Hemangioma in the C7 vertebral segment. 
There is disc bulge and mild osteophyte from C2 through C7. There is deformity 
of the ventral cord, appearing most pronounced at C4-5, with canal diameter 6 
mm, also at C3-4 and C6-7 where canal diameter is approximately 7 mm. Canal 
diameter at C5-6 is 8 mm, with slight cord deformity. Slight cord deformity at 
C2-3 with canal diameter 7 mm. The canal is open at C7-T1. The upper 2 thoracic 
levels show no significant canal or foraminal stenosis. 
Axial images show multilevel foraminal stenosis, moderate on the left at C2-3, 
with marked bilateral involvement at C3-4, C4-5, C5-6 and C6-7.
IMPRESSION: Spondylosis superimposed on short posterior elements. There is moderately severe 
canal stenosis at C4-5 with disc bulge deforming the ventral cord. Mild to 
moderate canal stenosis elsewhere as described, with cord deformity at C3-4 and 
C6-7. There is significant multilevel foraminal stenosis as described. 
No evidence for fracture or malignancy. Cord signal appears normal. 
Mild Modic type I change at C6-7. 
Visualized upper thoracic spine shows no significant disc abnormality or 
stenosis. 
Coronal localizer shows mild upper thoracic levoscoliosis. No gross brachial 
plexus pathology identified.

## 2022-12-19 IMAGING — CT CT CHEST WITHOUT CONTRAST
2 of 4 series · 11 of 36 positions shown, 13 images · non-contrast
Comparison: None

________________________________________________________________________________________________ 
CT CHEST WITHOUT CONTRAST, 12/19/2022 [DATE]: 
CLINICAL INDICATION: Right clavicular lump just become painful. 
A search for DICOM formatted images was conducted for prior CT imaging studies 
completed at a non-affiliated media free facility.
TECHNIQUE: The chest was scanned from base of neck through the lung bases 
without contrast on a high resolution low dose CT scanner. Routine MPR and MIP 
reconstruction images were performed. Count of known CT and Cardiac Nuclear 
Medicine studies performed in the previous 12 months = < 0.>

[Series 201: chest wo, idose (3) · axial · 0.75mm/px · z∈[+1087,+1367]mm · 8 of 174 slices shown, 10 images]
[im 17/174  mediastinal]
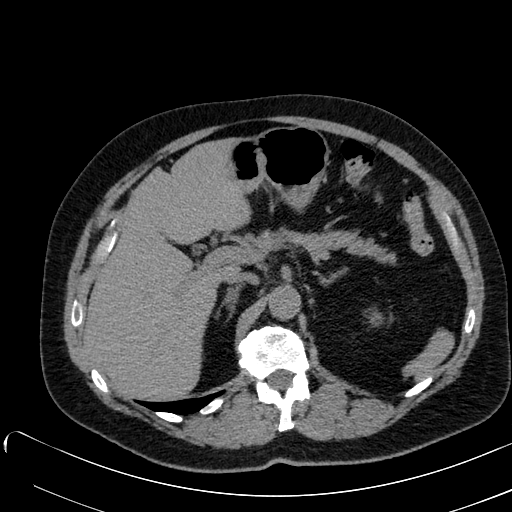
[im 17/174  lung]
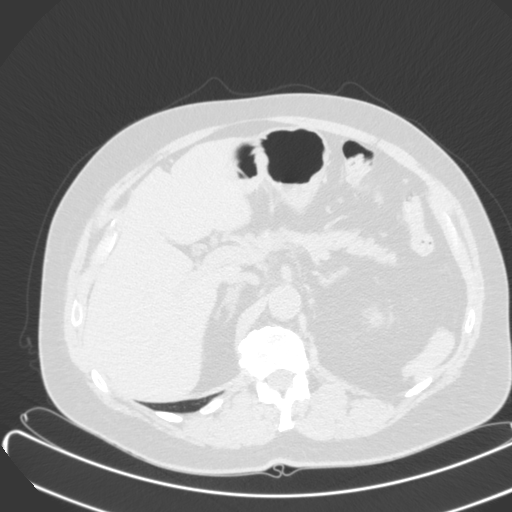
[im 33/174  lung]
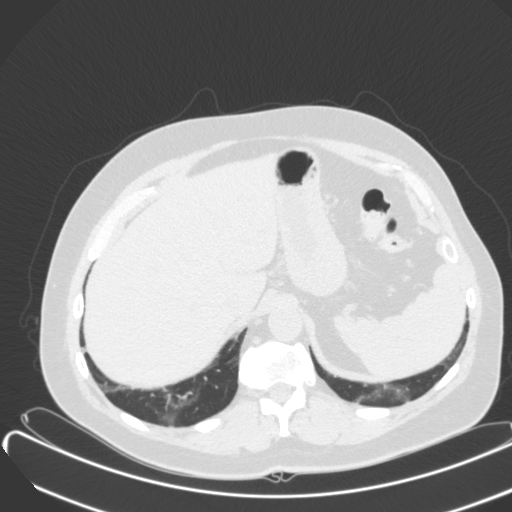
[im 58/174  lung]
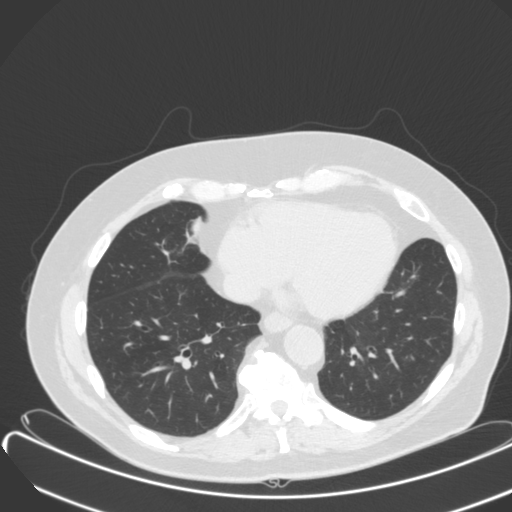
[im 75/174  lung]
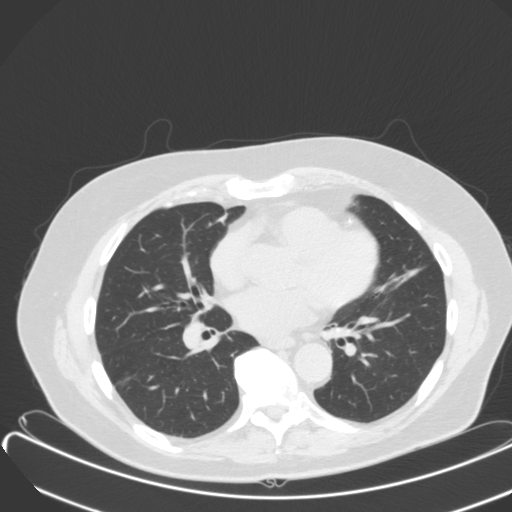
[im 99/174  mediastinal]
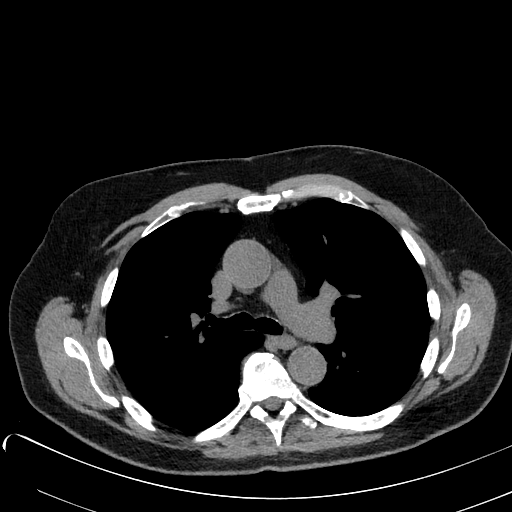
[im 99/174  lung]
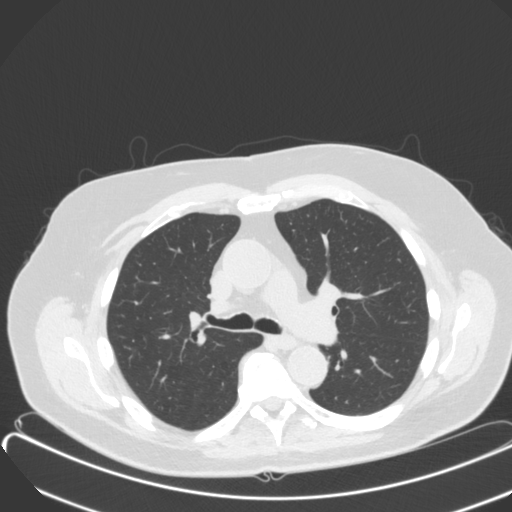
[im 116/174  lung]
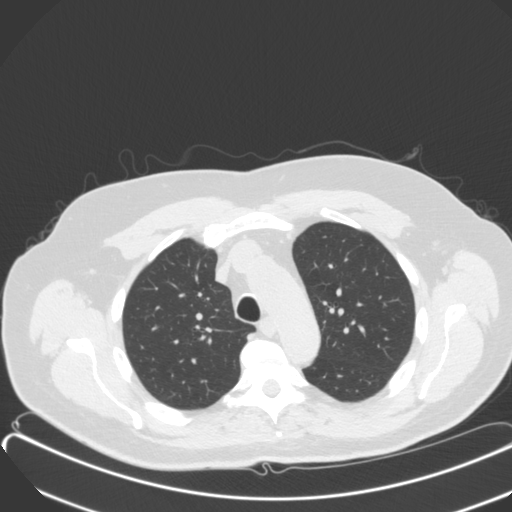
[im 141/174  lung]
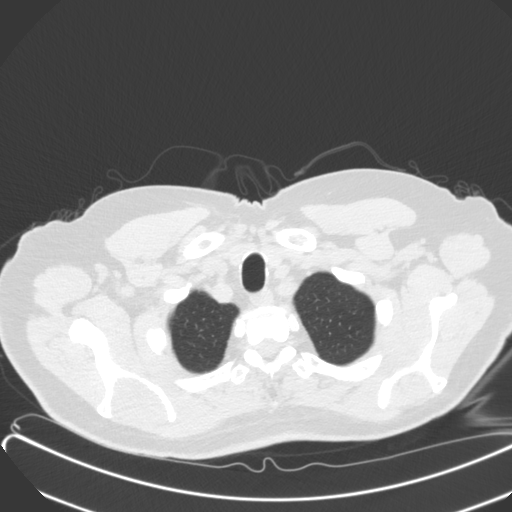
[im 157/174  lung]
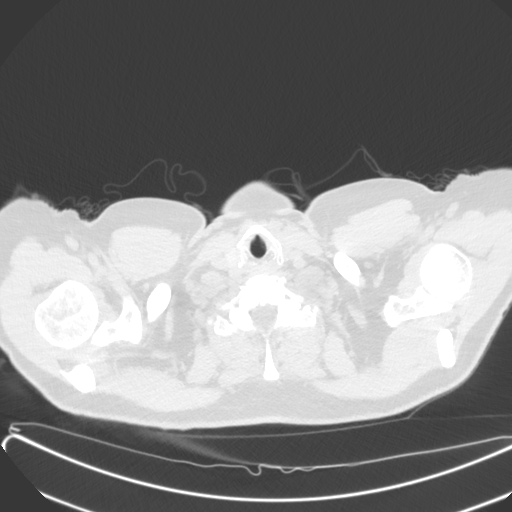

[Series 202: cor, idose (3) · coronal · 0.45mm/px · 3 of 160 slices shown]
[im 32/160  lung]
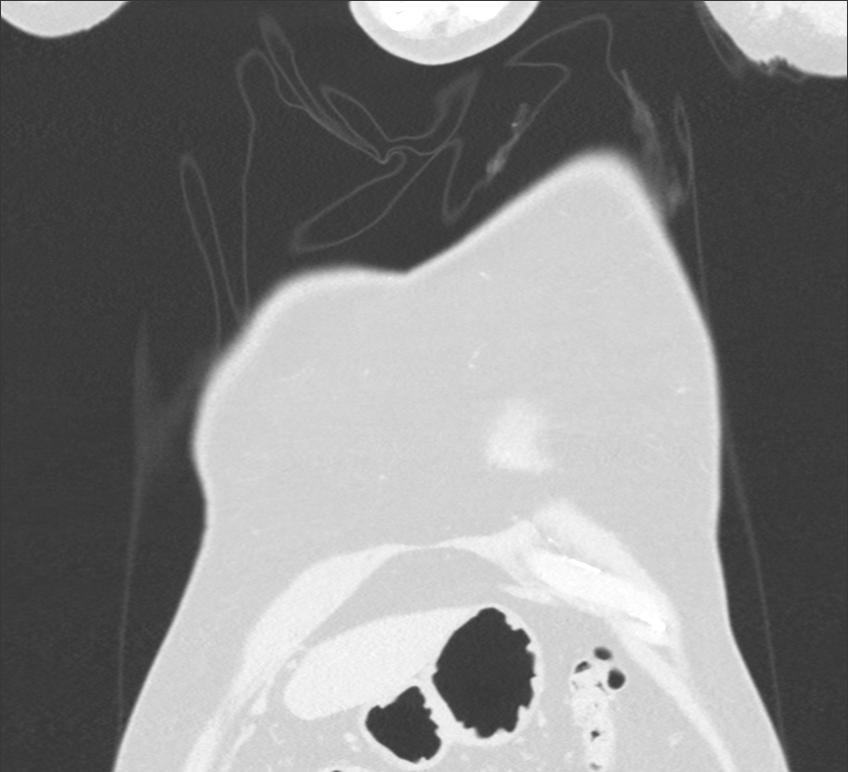
[im 64/160  lung]
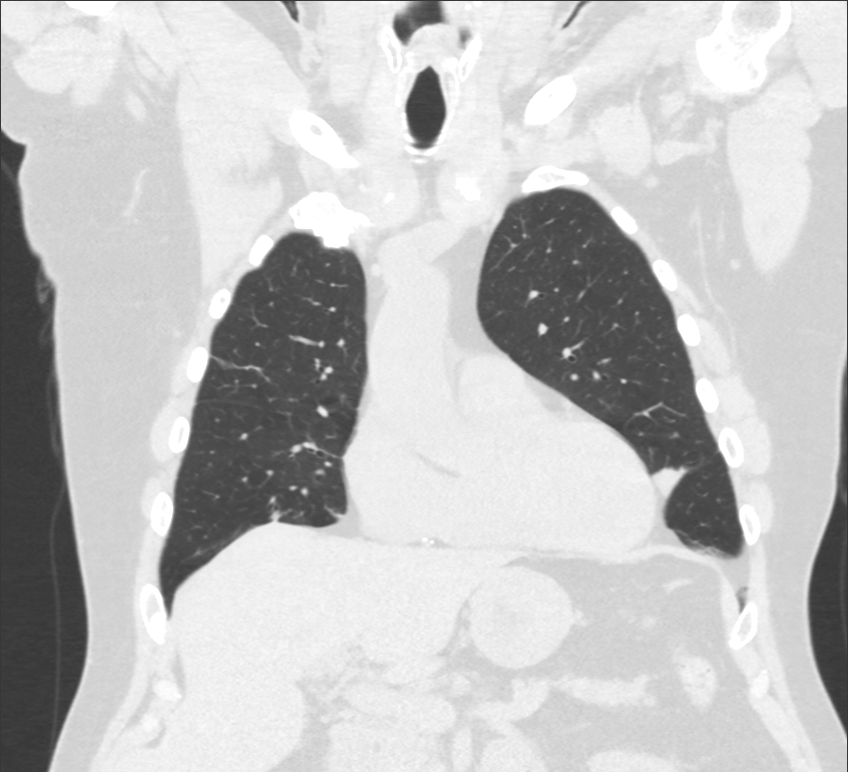
[im 96/160  lung]
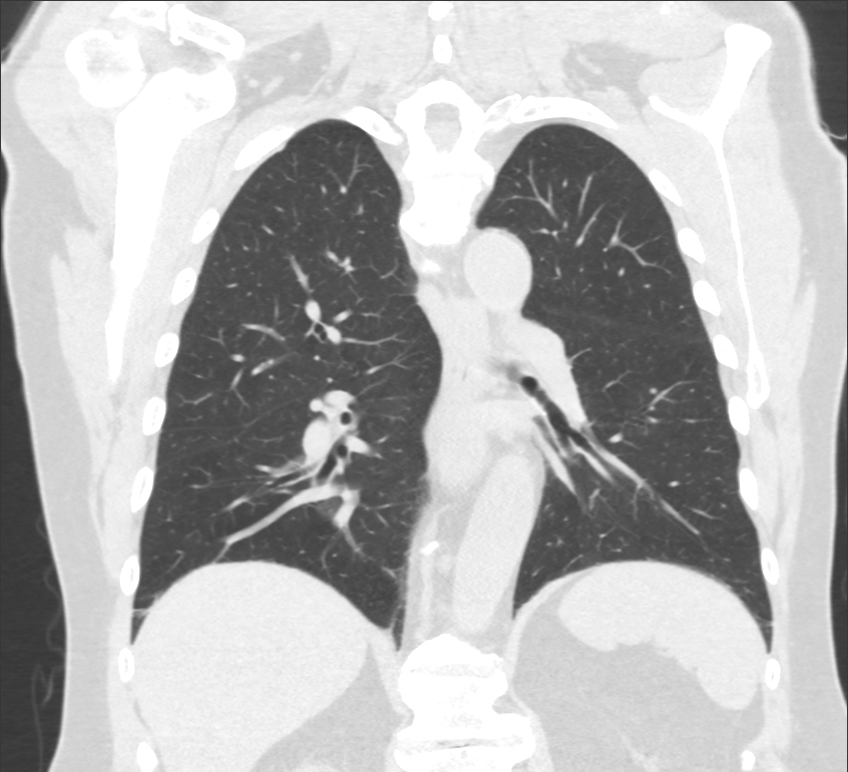

[11 of 36 positions shown; findings below may reference images not displayed]

FINDINGS: LUNGS AND PLEURA:  Mild linear atelectasis versus linear scarring the right 
middle lobe and lingula and bilateral lower lobes. The remainder of lungs are 
otherwise clear. No pulmonary nodules seen. No pleural effusion.  
MEDIASTINUM:  No adenopathy. Normal heart size. No pericardial effusion. 
Moderate coronary artery calcifications noted. 
CHEST WALL/AXILLA: No mass or adenopathy. No abnormality in the right clavicle 
and no mass or inflammatory change or other abnormality at site of reported lump 
over the right clavicle which has become painful over past 6 months and is 
currently marked with a BB. 
UPPER ABDOMEN: Liver cysts are present. 
MUSCULOSKELETAL: No acute abnormality.
IMPRESSION: Mild linear atelectasis versus scarring in the right middle lobe, lingula and 
bilateral lower lobes. 
No abnormality in the right clavicle and no mass or inflammatory change or other 
abnormality at site of reported lump over the right clavicle which has become 
painful over past 6 months and is currently marked with a BB. 
RADIATION DOSE REDUCTION: All CT scans are performed using radiation dose 
reduction techniques, when applicable.  Technical factors are evaluated and 
adjusted to ensure appropriate moderation of exposure.  Automated dose 
management technology is applied to adjust the radiation doses to minimize 
exposure while achieving diagnostic quality images.

## 2023-02-22 IMAGING — MR MULTI PARAMETRIC MRI PROSTATE W/O AND W
14 series · 48 of 48 positions shown · IV contrast (gadavist)
Comparison: None

________________________________________________________________________________________________ 
MULTI PARAMETRIC MRI PROSTATE W/O AND W, 02/22/2023 [DATE]: 
CLINICAL INDICATION: Elevated PSA. Enlarged prostate gland. 2 prior benign 
biopsies.
TECHNIQUE: Multiple parametric sequences were performed Pre-contrast: T1 axial 
of the entire pelvis. T2 sagittal, axial  and coronal,T1 axial, acquired of the 
prostate. Diffusion with multiple B values of 4333, 7400 calculated ADC value 
for mapping. Post contrast: Rapid sequence dynamic and axial planes through the 
prostate and seminal vesicles,T1 axial with fat sat of the entire pelvis. 3-D 
renderings were reconstructed on an independent workstation. The images were 
also evaluated with Dyna CAD computer aided detection. 7.5 mL of Gadavist were 
injected intravenously. As per [HOSPITAL] guidelines 3D 
reconstructions are performed with concurrent physician supervision. Patient was 
scanned on a 3T magnet.

[Series 101: survey-mst · axial · 10.0mm · 1.34mm/px · 1 of 14 slices shown]
[im 1/14]
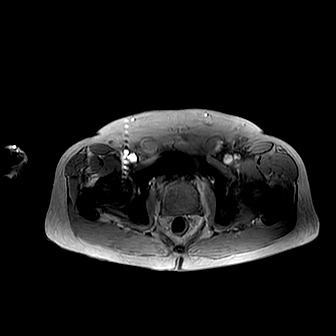

[Series 201: t1w_tse_ax · axial · 6.0mm · 0.35mm/px · 1 of 36 slices shown]
[im 1/36]
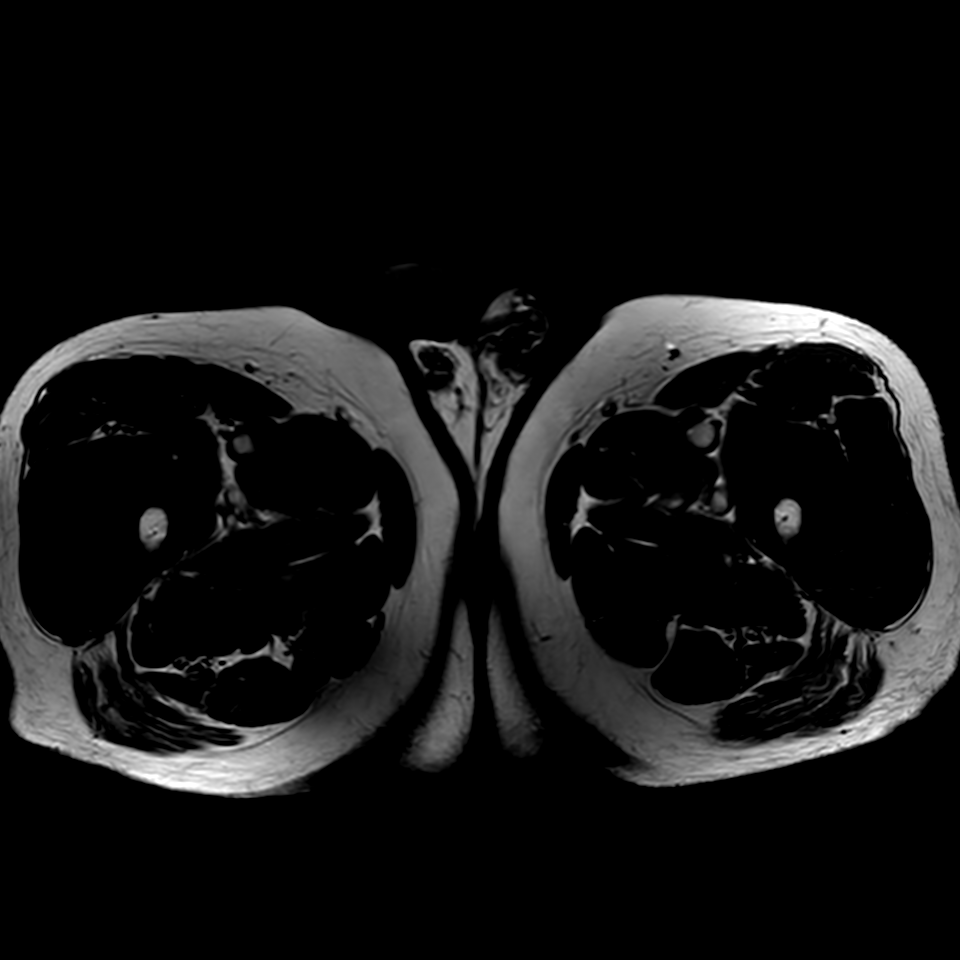

[Series 301: t2w sag · sagittal · 3.0mm · 0.42mm/px · 1 of 30 slices shown]
[im 1/30]
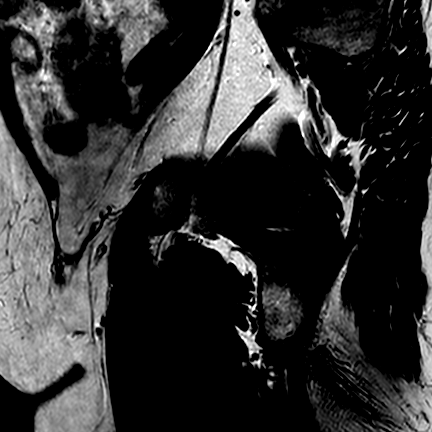

[Series 401: t2w cor · coronal · 3.0mm · 0.42mm/px · 1 of 30 slices shown]
[im 1/30]
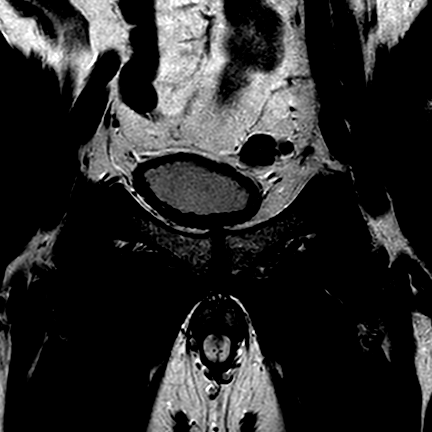

[Series 501: t2w ax · axial · 3.0mm · 0.38mm/px · 1 of 32 slices shown]
[im 1/32]
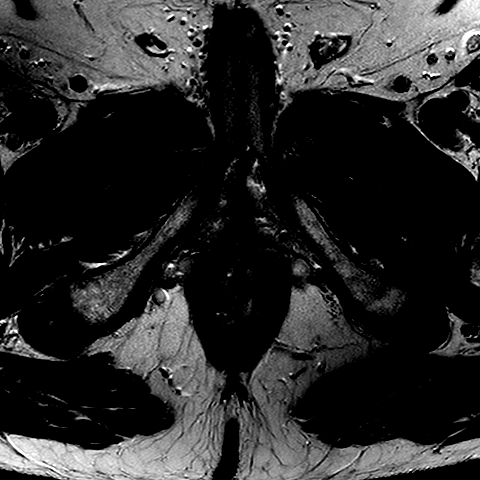

[Series 601: new-dwi_3b* 3mm* · axial · 3.0mm · 1.28mm/px · 1 of 64 slices shown]
[im 1/64]
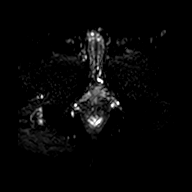

[Series 602: ADC · axial · 3.0mm · 1.28mm/px · 1 of 30 slices shown (1 of 2)]
[im 1/30]
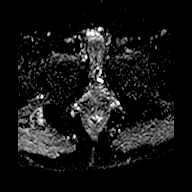

[Series 603: ADC · axial · 3.0mm · 1.28mm/px · 1 of 29 slices shown (2 of 2)]
[im 1/29]
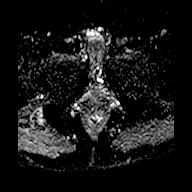

[Series 604: (id) · axial · 3.0mm · 1.28mm/px · 1 of 32 slices shown (1 of 3)]
[im 1/32]
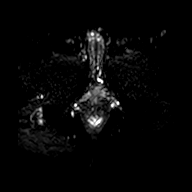

[Series 605: (id) · axial · 3.0mm · 1.28mm/px · 1 of 32 slices shown (2 of 3)]
[im 1/32]
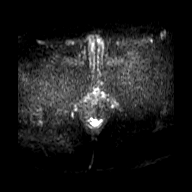

[Series 701: (id) · axial · 3.0mm · 1.28mm/px · 1 of 32 slices shown (3 of 3)]
[im 1/32]
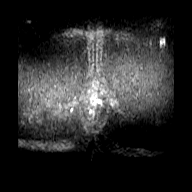

[Series 901: dyn 3mm*(ap) · axial · 3.0mm · 1.38mm/px · z∈[-57,+36]mm · 35 of 1440 slices shown]
[im 1/1440]
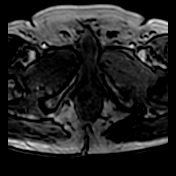
[im 43/1440]
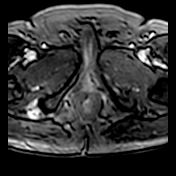
[im 85/1440]
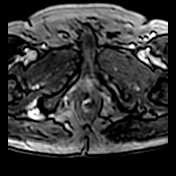
[im 127/1440]
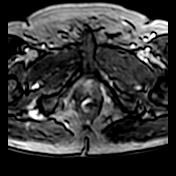
[im 170/1440]
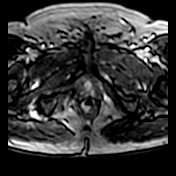
[im 212/1440]
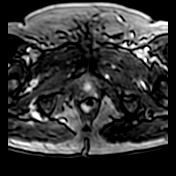
[im 254/1440]
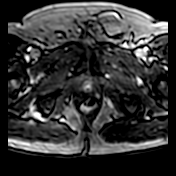
[im 297/1440]
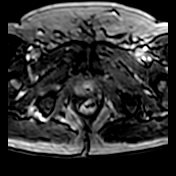
[im 339/1440]
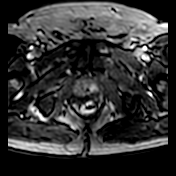
[im 381/1440]
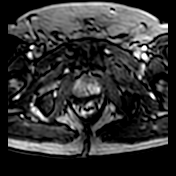
[im 424/1440]
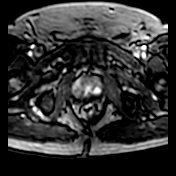
[im 466/1440]
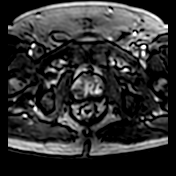
[im 508/1440]
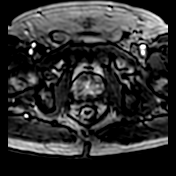
[im 551/1440]
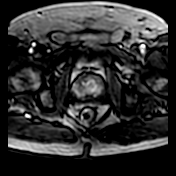
[im 593/1440]
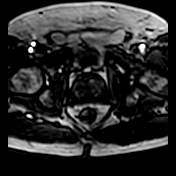
[im 635/1440]
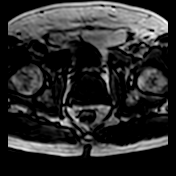
[im 678/1440]
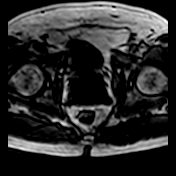
[im 720/1440]
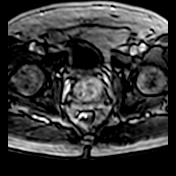
[im 762/1440]
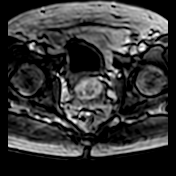
[im 805/1440]
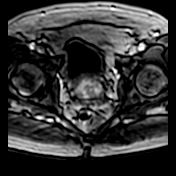
[im 847/1440]
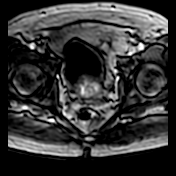
[im 889/1440]
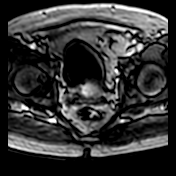
[im 932/1440]
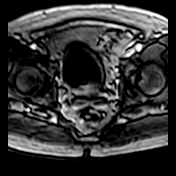
[im 974/1440]
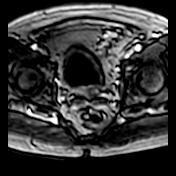
[im 1016/1440]
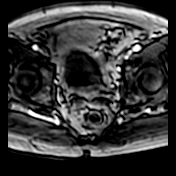
[im 1059/1440]
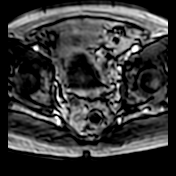
[im 1101/1440]
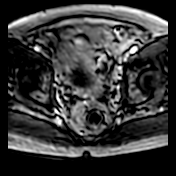
[im 1143/1440]
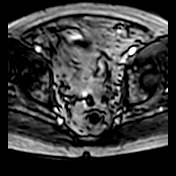
[im 1186/1440]
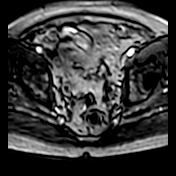
[im 1228/1440]
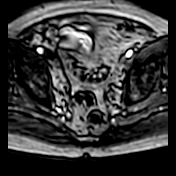
[im 1270/1440]
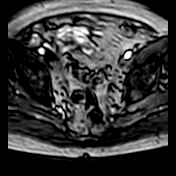
[im 1313/1440]
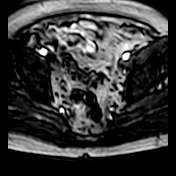
[im 1355/1440]
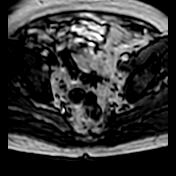
[im 1397/1440]
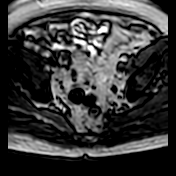
[im 1440/1440]
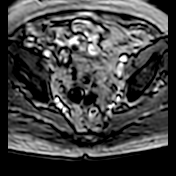

[Series 1001: DIXON · axial · 6.0mm · 0.44mm/px · 1 of 72 slices shown (1 of 2)]
[im 1/72]
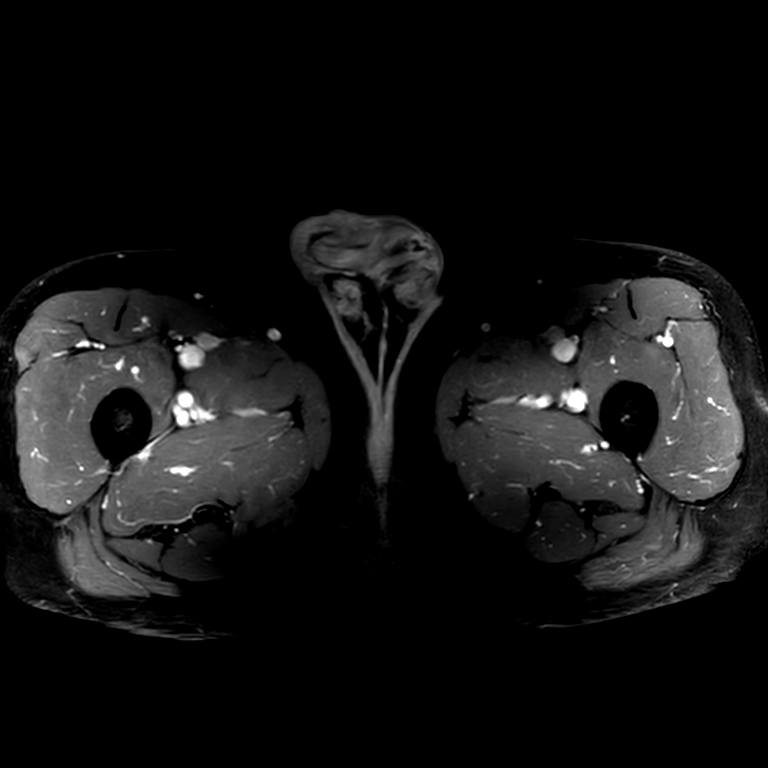

[Series 1002: DIXON · axial · 6.0mm · 0.44mm/px · 1 of 36 slices shown (2 of 2)]
[im 1/36]
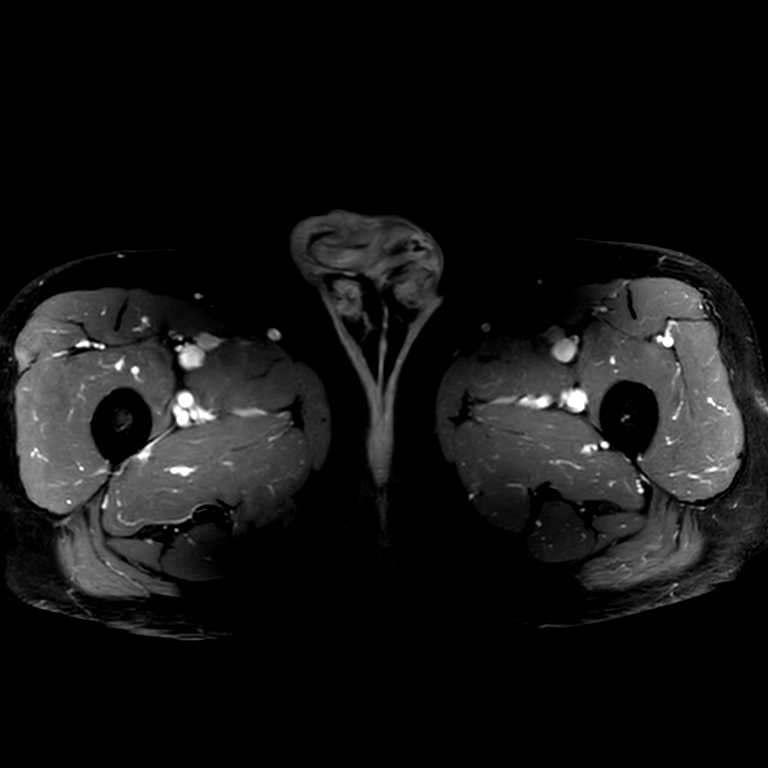

[48 of 48 positions shown; findings below may reference images not displayed]

FINDINGS: VOLUME: 54 cc 
CENTRAL GLAND: Nodular in appearance. No focal suspicious lesion seen. 
PERIPHERAL ZONE: No areas of restricted diffusion to suggest malignancy. 
PROSTATE CAPSULE: Intact. 
MUSCLE SIDE WALLS: Normal in appearance. 
SEMINAL VESICLES: Normal in appearance. 
BLADDER: Mildly trabeculated. 
LYMPHADENOPATHY: No adenopathy identified. 
BONES: No osseous lesions identified. 
ADDITIONAL FINDINGS: Diverticulosis.
IMPRESSION: Mildly enlarged and nodular gland. No focal area suspicious for malignancy. 
(PI-RADS 2): Low (clinically significant cancer is unlikely to be present).

## 2023-08-30 IMAGING — CT CT CERVICAL SPINE WITHOUT CONTRAST
3 series · 12 of 33 positions shown, 14 images · non-contrast
Comparison: MRI cervical spine from September 30, 2022.

________________________________________________________________________________________________ 
CT CERVICAL SPINE WITHOUT CONTRAST, 08/30/2023 [DATE]: 
CLINICAL INDICATION: Right-sided neck pain. Severe progression. 
. 
A search for DICOM formatted images was conducted for prior CT imaging studies 
completed at a non-affiliated media free facility.
TECHNIQUE: The cervical spine was scanned from the skull base through T1 
vertebra without contrast on a high-resolution CT scanner using dose reduction 
techniques. Routine MPR reconstructions were performed.

[Series 3: c_spine 2.0 b31s · axial · 0.24mm/px · z∈[-204,-96]mm · 4 of 78 slices shown, 5 images]
[im 12/78  soft-tissue]
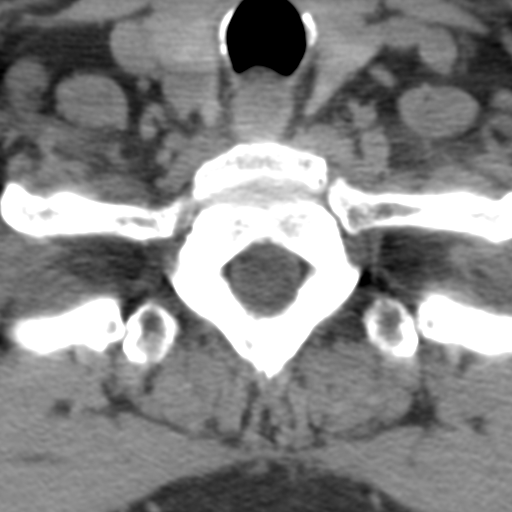
[im 12/78  bone]
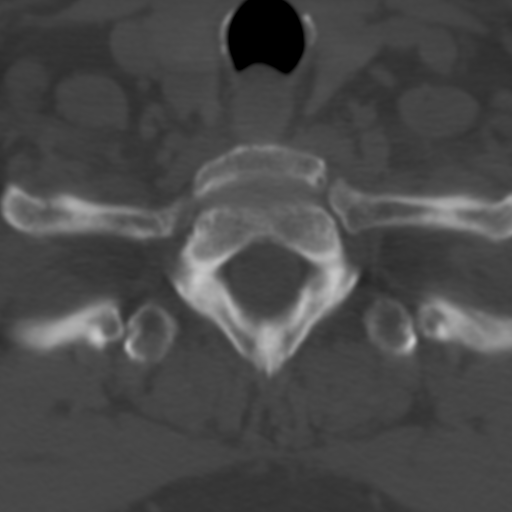
[im 30/78  bone]
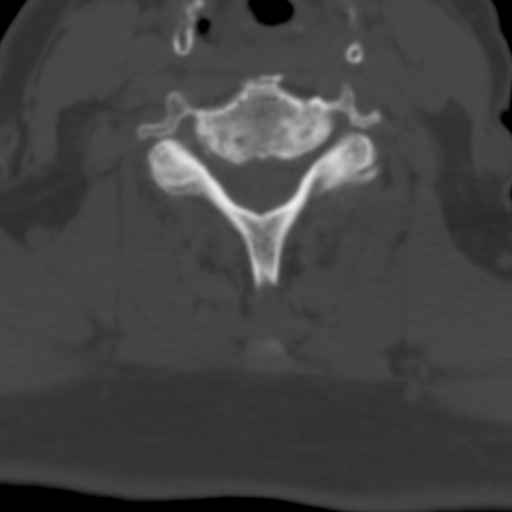
[im 48/78  bone]
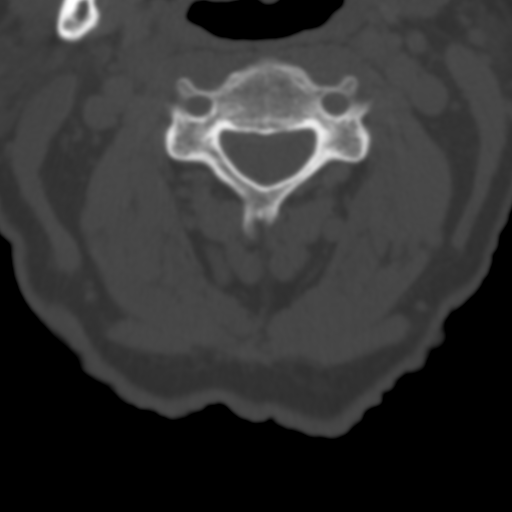
[im 66/78  bone]
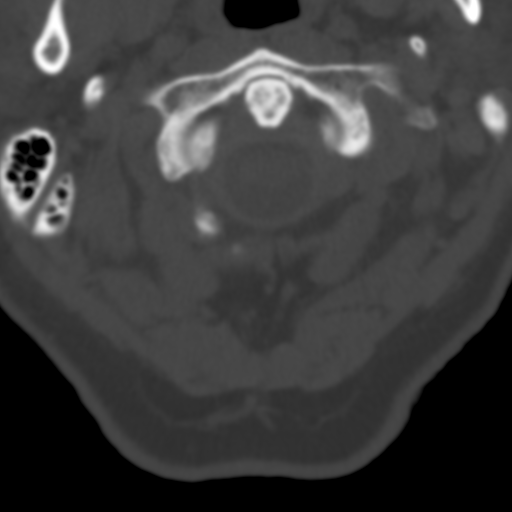

[Series 5: c_spine coronal · coronal · 0.24mm/px · 3 of 53 slices shown]
[im 11/53  bone]
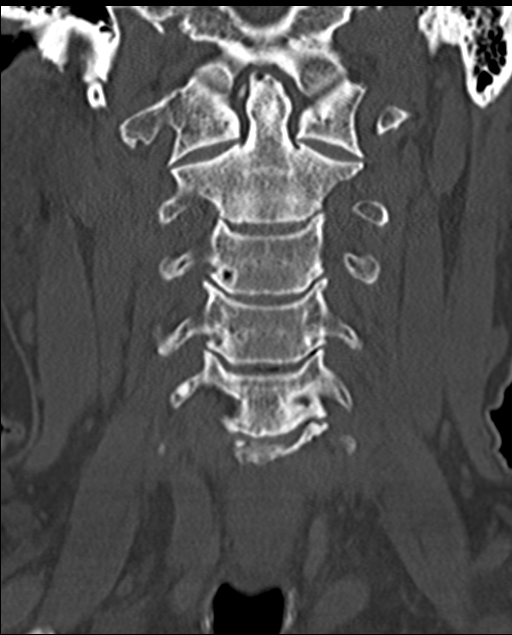
[im 21/53  bone]
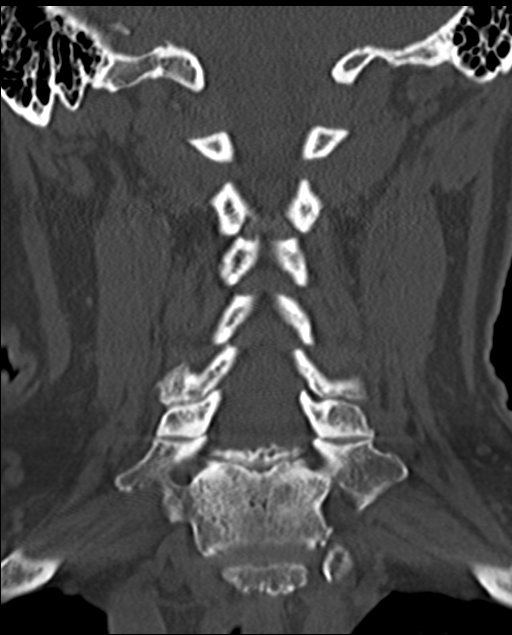
[im 32/53  bone]
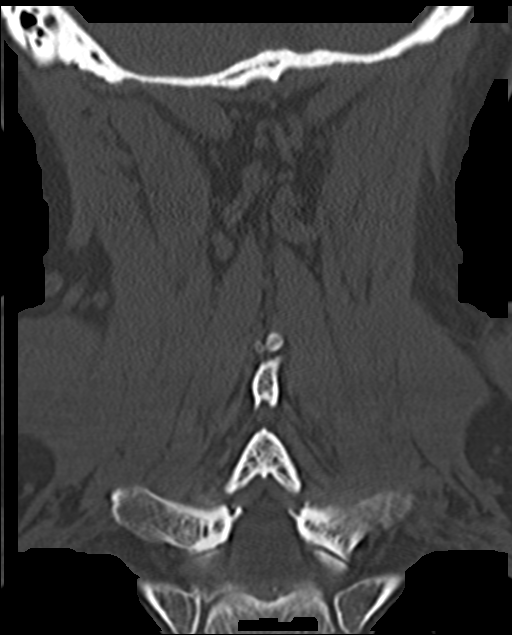

[Series 7: sag · sagittal · 0.23mm/px · 5 of 56 slices shown, 6 images]
[im 19/56  bone]
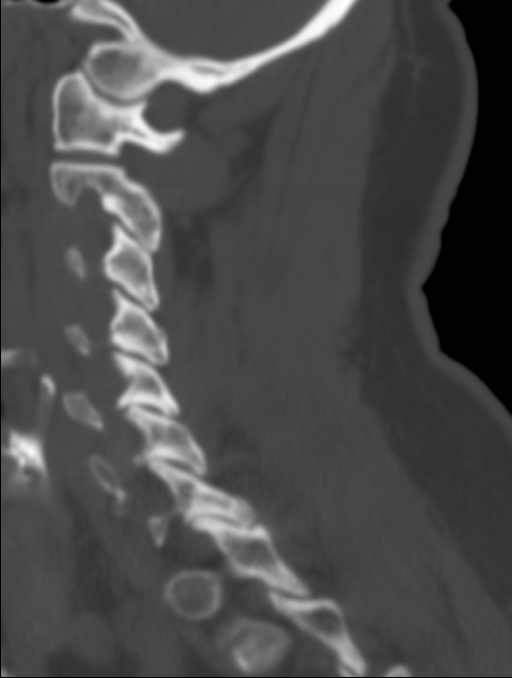
[im 23/56  bone]
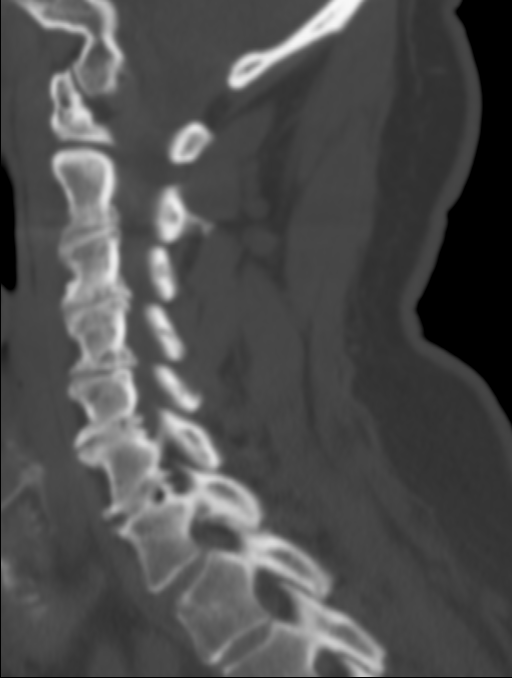
[im 28/56  soft-tissue]
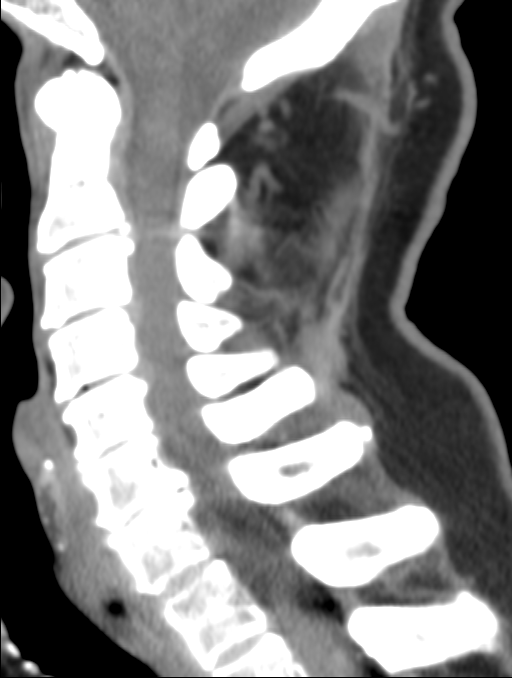
[im 28/56  bone]
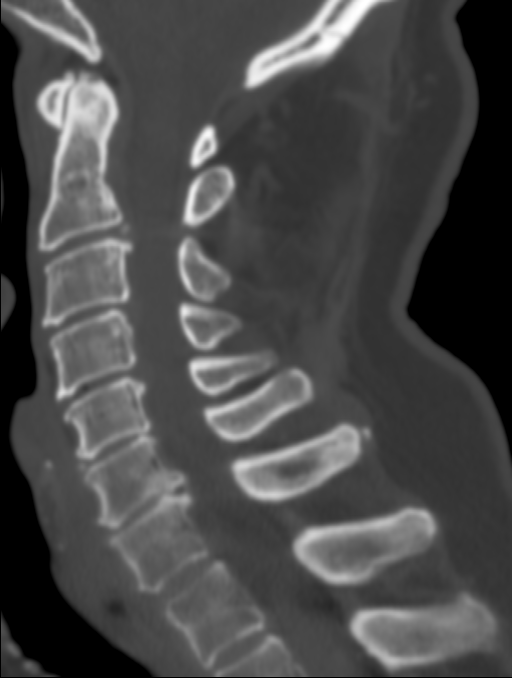
[im 33/56  bone]
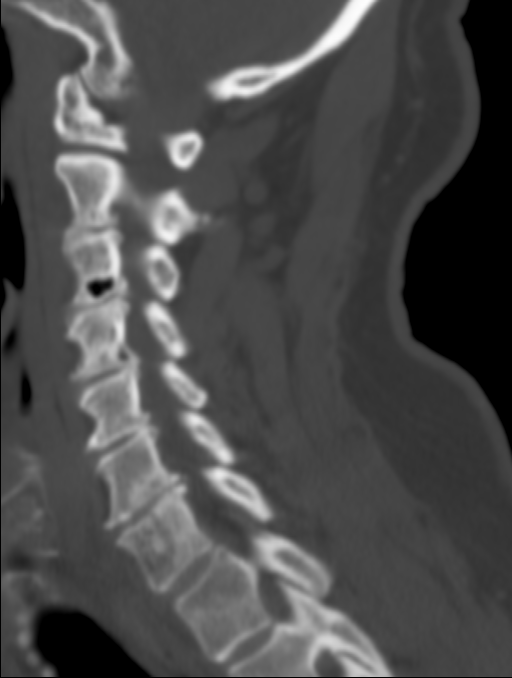
[im 37/56  bone]
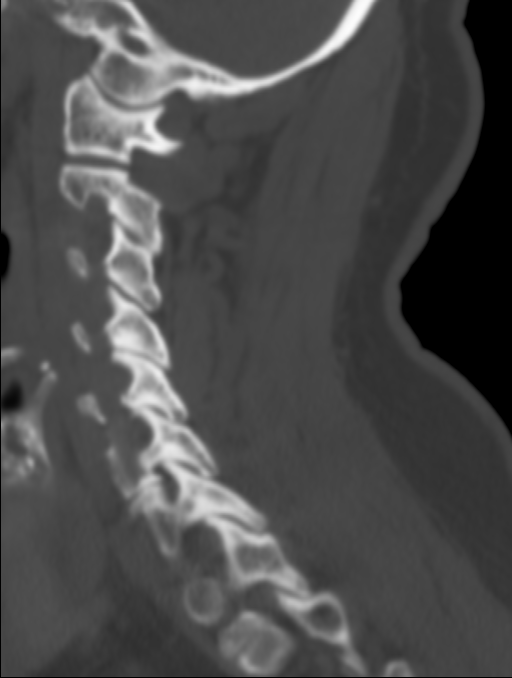

[12 of 33 positions shown; findings below may reference images not displayed]

Count of known CT and Cardiac Nuclear Medicine studies performed in the previous 
12 months = 1.
FINDINGS: -------------------------------------------------------------------------------- 
------ 
GENERAL: 
Mild retrolisthesis of C3 on C4, C4 on C5, C5 on C6. No acute fracture. No focal 
suspect lytic or blastic lesion. Visualized extraspinal soft tissues reveal mild 
vascular calcification. 
-------------------------------------------------------------------------------- 
------     
RELEVANT SEGMENTAL (levels with severe stenosis or significant findings):  
C3-C4: Loss of disc height with disc osteophyte complex and central disc 
herniation. Bilateral uncovertebral joint hypertrophy. Mild central canal 
narrowing. Severe bilateral neural foraminal narrowing. This level is stable. 
C4-C5: Loss of disc height with disc osteophyte complex and central disc 
herniation. Bilateral uncovertebral joint hypertrophy. Mild central canal 
narrowing. Severe bilateral neural foraminal narrowing. This level is stable. 
C5-C6: Loss of disc height. Disc osteophyte complex with bilateral uncovertebral 
joint hypertrophy. No significant central canal narrowing. Severe bilateral 
neural foraminal narrowing. This level is stable. 
C6-C7: Loss of disc height. Disc osteophyte complex with bilateral uncovertebral 
joint hypertrophy. Mild central canal narrowing. Severe bilateral neural 
foraminal narrowing. This level is stable. 
Additional scattered discogenic/degenerative changes are noted 
-------------------------------------------------------------------------------- 
-----
IMPRESSION: 1.  Discogenic/degenerative changes above, stable. 
2.  Severe bilateral neural foraminal narrowing from C3-C4 through C6-C7. 
RADIATION DOSE REDUCTION: All CT scans are performed using radiation dose 
reduction techniques, when applicable.  Technical factors are evaluated and 
adjusted to ensure appropriate moderation of exposure.  Automated dose 
management technology is applied to adjust the radiation doses to minimize 
exposure while achieving diagnostic quality images.
